# Patient Record
Sex: Female | Born: 1985 | Race: White | Hispanic: No | Marital: Married | Smoking: Current every day smoker
Health system: Southern US, Community
[De-identification: ages and names within clinical notes are randomized; demographics above are authoritative.]

## PROBLEM LIST (undated history)

## (undated) DIAGNOSIS — R569 Unspecified convulsions: Secondary | ICD-10-CM

## (undated) DIAGNOSIS — F32A Depression, unspecified: Secondary | ICD-10-CM

## (undated) DIAGNOSIS — F329 Major depressive disorder, single episode, unspecified: Secondary | ICD-10-CM

## (undated) DIAGNOSIS — F419 Anxiety disorder, unspecified: Secondary | ICD-10-CM

## (undated) HISTORY — PX: HIP SURGERY: SHX245

---

## 2004-09-22 ENCOUNTER — Emergency Department: Payer: Self-pay | Admitting: Internal Medicine

## 2010-12-22 ENCOUNTER — Emergency Department: Payer: Self-pay | Admitting: Emergency Medicine
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
## 2014-04-29 ENCOUNTER — Observation Stay: Payer: Self-pay | Admitting: Obstetrics and Gynecology

## 2014-05-01 ENCOUNTER — Observation Stay: Payer: Self-pay | Admitting: Obstetrics and Gynecology

## 2014-05-01 LAB — URIC ACID: URIC ACID: 4 mg/dL (ref 2.6–6.0)

## 2014-05-01 LAB — BASIC METABOLIC PANEL
Anion Gap: 10 (ref 7–16)
BUN: 12 mg/dL (ref 7–18)
CALCIUM: 9 mg/dL (ref 8.5–10.1)
CO2: 22 mmol/L (ref 21–32)
CREATININE: 0.67 mg/dL (ref 0.60–1.30)
Chloride: 105 mmol/L (ref 98–107)
EGFR (African American): >60
Glucose: 85 mg/dL (ref 65–99)
Osmolality: 273 (ref 275–301)
Potassium: 3.8 mmol/L (ref 3.5–5.1)
Sodium: 137 mmol/L (ref 136–145)

## 2014-05-01 LAB — SGOT (AST)(ARMC): SGOT(AST): 16 U/L (ref 15–37)

## 2014-05-01 LAB — HEMOGLOBIN: HGB: 10.1 g/dL — ABNORMAL LOW (ref 12.0–16.0)

## 2014-05-01 LAB — DRUG SCREEN, URINE
Amphetamines, Ur Screen: NEGATIVE (ref ?–1000)
BARBITURATES, UR SCREEN: NEGATIVE (ref ?–200)
Benzodiazepine, Ur Scrn: POSITIVE (ref ?–200)
COCAINE METABOLITE, UR ~~LOC~~: NEGATIVE (ref ?–300)
Cannabinoid 50 Ng, Ur ~~LOC~~: NEGATIVE (ref ?–50)
MDMA (ECSTASY) UR SCREEN: NEGATIVE (ref ?–500)
METHADONE, UR SCREEN: NEGATIVE (ref ?–300)
Opiate, Ur Screen: POSITIVE (ref ?–300)
PHENCYCLIDINE (PCP) UR S: NEGATIVE (ref ?–25)
Tricyclic, Ur Screen: NEGATIVE (ref ?–1000)

## 2014-05-01 LAB — WBC: WBC: 7.3 10*3/uL (ref 3.6–11.0)

## 2014-05-01 LAB — PROTEIN / CREATININE RATIO, URINE
Creatinine, Urine: 229.5 mg/dL — ABNORMAL HIGH (ref 30.0–125.0)
PROTEIN/CREAT. RATIO: 118 mg/g{creat} (ref 0–200)
Protein, Random Urine: 27 mg/dL — ABNORMAL HIGH (ref 0–12)

## 2014-05-01 LAB — HEMATOCRIT: HCT: 30.5 % — ABNORMAL LOW (ref 35.0–47.0)

## 2014-05-01 LAB — RBC: RBC: 3.66 10*6/uL — ABNORMAL LOW (ref 3.80–5.20)

## 2014-05-01 LAB — PLATELET COUNT: Platelet: 261 10*3/uL (ref 150–440)

## 2014-05-15 ENCOUNTER — Inpatient Hospital Stay: Payer: Self-pay

## 2014-05-15 LAB — DRUG SCREEN, URINE
Amphetamines, Ur Screen: NEGATIVE (ref ?–1000)
Barbiturates, Ur Screen: NEGATIVE (ref ?–200)
Benzodiazepine, Ur Scrn: POSITIVE (ref ?–200)
CANNABINOID 50 NG, UR ~~LOC~~: NEGATIVE (ref ?–50)
Cocaine Metabolite,Ur ~~LOC~~: NEGATIVE (ref ?–300)
MDMA (ECSTASY) UR SCREEN: NEGATIVE (ref ?–500)
Methadone, Ur Screen: NEGATIVE (ref ?–300)
Opiate, Ur Screen: NEGATIVE (ref ?–300)
PHENCYCLIDINE (PCP) UR S: NEGATIVE (ref ?–25)
Tricyclic, Ur Screen: NEGATIVE (ref ?–1000)

## 2014-05-15 LAB — CBC WITH DIFFERENTIAL/PLATELET
Basophil #: 0 10*3/uL (ref 0.0–0.1)
Basophil %: 0.3 %
Eosinophil #: 0.2 10*3/uL (ref 0.0–0.7)
Eosinophil %: 2.8 %
HCT: 33.6 % — ABNORMAL LOW (ref 35.0–47.0)
HGB: 10.8 g/dL — AB (ref 12.0–16.0)
Lymphocyte #: 2 10*3/uL (ref 1.0–3.6)
Lymphocyte %: 24 %
MCH: 27.2 pg (ref 26.0–34.0)
MCHC: 32.2 g/dL (ref 32.0–36.0)
MCV: 85 fL (ref 80–100)
Monocyte #: 0.6 x10 3/mm (ref 0.2–0.9)
Monocyte %: 7.1 %
Neutrophil #: 5.6 10*3/uL (ref 1.4–6.5)
Neutrophil %: 65.8 %
Platelet: 325 10*3/uL (ref 150–440)
RBC: 3.98 10*6/uL (ref 3.80–5.20)
RDW: 14.7 % — ABNORMAL HIGH (ref 11.5–14.5)
WBC: 8.4 10*3/uL (ref 3.6–11.0)

## 2014-05-15 LAB — APTT: Activated PTT: 26.4 secs (ref 23.6–35.9)

## 2014-05-15 LAB — PROTEIN / CREATININE RATIO, URINE
Creatinine, Urine: 54.7 mg/dL (ref 30.0–125.0)
PROTEIN, RANDOM URINE: 202 mg/dL — AB (ref 0–12)
PROTEIN/CREAT. RATIO: 3693 mg/g{creat} — AB (ref 0–200)

## 2014-05-15 LAB — PROTIME-INR
INR: 0.8
PROTHROMBIN TIME: 11.5 s (ref 11.5–14.7)

## 2014-05-15 LAB — GC/CHLAMYDIA PROBE AMP

## 2014-05-15 LAB — FIBRIN DEGRADATION PROD.(ARMC ONLY): Fibrin Degradation Prod.: <10 ug/ml (ref 2.1–7.7)

## 2014-05-16 LAB — HEPATIC FUNCTION PANEL A (ARMC)
ALBUMIN: 2 g/dL — AB (ref 3.4–5.0)
Alkaline Phosphatase: 123 U/L — ABNORMAL HIGH
Bilirubin, Direct: <0.1 mg/dL (ref 0.0–0.2)
Bilirubin,Total: 0.1 mg/dL — ABNORMAL LOW (ref 0.2–1.0)
SGOT(AST): 22 U/L (ref 15–37)
SGPT (ALT): 14 U/L
TOTAL PROTEIN: 5.8 g/dL — AB (ref 6.4–8.2)

## 2014-05-17 LAB — CBC WITH DIFFERENTIAL/PLATELET
Basophil #: 0.1 10*3/uL (ref 0.0–0.1)
Basophil %: 1.1 %
EOS ABS: 0.1 10*3/uL (ref 0.0–0.7)
Eosinophil %: 1 %
HCT: 29.5 % — ABNORMAL LOW (ref 35.0–47.0)
HGB: 9.5 g/dL — ABNORMAL LOW (ref 12.0–16.0)
Lymphocyte #: 1.5 10*3/uL (ref 1.0–3.6)
Lymphocyte %: 14.4 %
MCH: 27.6 pg (ref 26.0–34.0)
MCHC: 32.4 g/dL (ref 32.0–36.0)
MCV: 85 fL (ref 80–100)
MONO ABS: 0.9 x10 3/mm (ref 0.2–0.9)
MONOS PCT: 8.8 %
NEUTROS PCT: 74.7 %
Neutrophil #: 8 10*3/uL — ABNORMAL HIGH (ref 1.4–6.5)
Platelet: 252 10*3/uL (ref 150–440)
RBC: 3.46 10*6/uL — ABNORMAL LOW (ref 3.80–5.20)
RDW: 15.1 % — ABNORMAL HIGH (ref 11.5–14.5)
WBC: 10.7 10*3/uL (ref 3.6–11.0)

## 2014-05-17 LAB — COMPREHENSIVE METABOLIC PANEL
ALBUMIN: 2 g/dL — AB (ref 3.4–5.0)
ANION GAP: 8 (ref 7–16)
Alkaline Phosphatase: 122 U/L — ABNORMAL HIGH
BUN: 6 mg/dL — ABNORMAL LOW (ref 7–18)
Bilirubin,Total: 0.1 mg/dL — ABNORMAL LOW (ref 0.2–1.0)
Calcium, Total: 7.7 mg/dL — ABNORMAL LOW (ref 8.5–10.1)
Chloride: 111 mmol/L — ABNORMAL HIGH (ref 98–107)
Co2: 24 mmol/L (ref 21–32)
Creatinine: 0.91 mg/dL (ref 0.60–1.30)
EGFR (African American): >60
EGFR (Non-African Amer.): >60
GLUCOSE: 84 mg/dL (ref 65–99)
OSMOLALITY: 282 (ref 275–301)
Potassium: 3.7 mmol/L (ref 3.5–5.1)
SGOT(AST): 18 U/L (ref 15–37)
SGPT (ALT): 13 U/L — ABNORMAL LOW
Sodium: 143 mmol/L (ref 136–145)
Total Protein: 5 g/dL — ABNORMAL LOW (ref 6.4–8.2)

## 2014-05-18 LAB — HEMATOCRIT: HCT: 26.4 % — ABNORMAL LOW (ref 35.0–47.0)

## 2014-05-21 LAB — AEROBIC CULTURE

## 2014-05-21 LAB — MISC AER/ANAEROBIC CULT.

## 2014-05-23 LAB — MISC AER/ANAEROBIC CULT.

## 2014-09-11 LAB — SURGICAL PATHOLOGY

## 2014-09-26 NOTE — H&P (Signed)
L&D Evaluation:  History:  HPI Pt is a 29 yo G2P0101 pt of [facility] at 31.[redacted] weeks GA with an EDC of 10/10/14 who presents to L&D with reports of decreased fetal movement. She reports that she has not felt the baby move today or yesterday but is not sure when the movement stopped. She denies vb, lof or ctx. Her prenatal history is significant for IOL at 33 weeks with G1 for HELLP syndrome, late entry to care at 19 weeks, benzo use in pregnancy. She is A+, RNI, HIV neg, RPR NR, Hep B neg, varicella unknown, GBs unknown   Patient's Medical History genital warts, HELLP syndrome, seizures x2 in 2013, 2015, PPD, chlamydia, migraines, congenital hip dysplasia, "disc in neck problems"   Patient's Surgical History hip replacement/ hip surgery x4, LEEP,   Medications Pre Serbiaatal Vitamins  diclegis, buspar, ultram, ativan   Allergies NKDA   Social History tobacco   Family History Non-Contributory   ROS:  ROS All systems were reviewed.  HEENT, CNS, GI, GU, Respiratory, CV, Renal and Musculoskeletal systems were found to be normal.   Exam:  Vital Signs some elevated diastolic pressures   General pt visably upset stating " it is all my fault"   Mental Status clear   Chest clear   Heart normal sinus rhythm   Abdomen gravid, non-tender   Pelvic no external lesions, 1/40/-3 at 2000   Mebranes Intact   FHT absent   Ucx absent   Skin dry, no lesions, no rashes   Lymph no lymphadenopathy   Impression:  Impression IUFD at 31.1 weeks   Plan:  Plan admit for IOL for IUFD, cytotec to induce labor, labs, fetal and placental cultures after delivery   Follow Up Appointment need to schedule   Electronic Signatures: Jannet MantisSubudhi, Melyssa Signor (CNM)  (Signed 28-Dec-15 21:47)  Authored: L&D Evaluation   Last Updated: 28-Dec-15 21:47 by Jannet MantisSubudhi, Kyrin Gratz (CNM)

## 2016-01-04 ENCOUNTER — Emergency Department (HOSPITAL_BASED_OUTPATIENT_CLINIC_OR_DEPARTMENT_OTHER): Payer: Medicare Other

## 2016-01-04 ENCOUNTER — Emergency Department (HOSPITAL_BASED_OUTPATIENT_CLINIC_OR_DEPARTMENT_OTHER)
Admission: EM | Admit: 2016-01-04 | Discharge: 2016-01-04 | Disposition: A | Discharge status: Home / Self Care | Payer: Medicare Other | Attending: Emergency Medicine | Admitting: Emergency Medicine

## 2016-01-04 ENCOUNTER — Encounter (HOSPITAL_BASED_OUTPATIENT_CLINIC_OR_DEPARTMENT_OTHER): Payer: Self-pay

## 2016-01-04 DIAGNOSIS — R079 Chest pain, unspecified: Secondary | ICD-10-CM | POA: Diagnosis present

## 2016-01-04 DIAGNOSIS — F17210 Nicotine dependence, cigarettes, uncomplicated: Secondary | ICD-10-CM | POA: Diagnosis not present

## 2016-01-04 DIAGNOSIS — R0789 Other chest pain: Secondary | ICD-10-CM | POA: Insufficient documentation

## 2016-01-04 DIAGNOSIS — Z79899 Other long term (current) drug therapy: Secondary | ICD-10-CM | POA: Insufficient documentation

## 2016-01-04 HISTORY — DX: Unspecified convulsions: R56.9

## 2016-01-04 HISTORY — DX: Major depressive disorder, single episode, unspecified: F32.9

## 2016-01-04 HISTORY — DX: Depression, unspecified: F32.A

## 2016-01-04 HISTORY — DX: Anxiety disorder, unspecified: F41.9

## 2016-01-04 LAB — COMPREHENSIVE METABOLIC PANEL
ALBUMIN: 4.1 g/dL (ref 3.5–5.0)
ALK PHOS: 59 U/L (ref 38–126)
ALT: 18 U/L (ref 14–54)
ANION GAP: 10 (ref 5–15)
AST: 18 U/L (ref 15–41)
BUN: 17 mg/dL (ref 6–20)
CALCIUM: 8.7 mg/dL — AB (ref 8.9–10.3)
CO2: 26 mmol/L (ref 22–32)
Chloride: 101 mmol/L (ref 101–111)
Creatinine, Ser: 0.83 mg/dL (ref 0.44–1.00)
Glucose, Bld: 93 mg/dL (ref 65–99)
Potassium: 3 mmol/L — ABNORMAL LOW (ref 3.5–5.1)
SODIUM: 137 mmol/L (ref 135–145)
Total Bilirubin: 0.2 mg/dL — ABNORMAL LOW (ref 0.3–1.2)
Total Protein: 7.1 g/dL (ref 6.5–8.1)

## 2016-01-04 LAB — CBC WITH DIFFERENTIAL/PLATELET
BASOS ABS: 0 10*3/uL (ref 0.0–0.1)
BASOS PCT: 0 %
EOS ABS: 0.1 10*3/uL (ref 0.0–0.7)
Eosinophils Relative: 1 %
HCT: 38 % (ref 36.0–46.0)
HEMOGLOBIN: 13.1 g/dL (ref 12.0–15.0)
Lymphocytes Relative: 38 %
Lymphs Abs: 3.2 10*3/uL (ref 0.7–4.0)
MCH: 29.6 pg (ref 26.0–34.0)
MCHC: 34.5 g/dL (ref 30.0–36.0)
MCV: 85.8 fL (ref 78.0–100.0)
MONOS PCT: 9 %
Monocytes Absolute: 0.7 10*3/uL (ref 0.1–1.0)
NEUTROS PCT: 52 %
Neutro Abs: 4.5 10*3/uL (ref 1.7–7.7)
Platelets: 355 10*3/uL (ref 150–400)
RBC: 4.43 MIL/uL (ref 3.87–5.11)
RDW: 13.3 % (ref 11.5–15.5)
WBC: 8.4 10*3/uL (ref 4.0–10.5)

## 2016-01-04 LAB — PREGNANCY, URINE: PREG TEST UR: NEGATIVE

## 2016-01-04 LAB — TROPONIN I

## 2016-01-04 MED ORDER — MORPHINE SULFATE (PF) 4 MG/ML IV SOLN
4.0000 mg | INTRAVENOUS | Status: DC | PRN
  Administered 2016-01-04: 4 mg via INTRAVENOUS
  Filled 2016-01-04: qty 1

## 2016-01-04 MED ORDER — IOPAMIDOL (ISOVUE-370) INJECTION 76%
100.0000 mL | Freq: Once | INTRAVENOUS | Status: AC | PRN
  Administered 2016-01-04: 100 mL via INTRAVENOUS

## 2016-01-04 MED ORDER — FAMOTIDINE IN NACL 20-0.9 MG/50ML-% IV SOLN
20.0000 mg | Freq: Once | INTRAVENOUS | Status: AC
Start: 2016-01-04 — End: 2016-01-04
  Administered 2016-01-04: 20 mg via INTRAVENOUS
  Filled 2016-01-04: qty 50

## 2016-01-04 MED ORDER — ONDANSETRON HCL 4 MG/2ML IJ SOLN
4.0000 mg | Freq: Once | INTRAMUSCULAR | Status: AC
Start: 2016-01-04 — End: 2016-01-04
  Administered 2016-01-04: 4 mg via INTRAVENOUS
  Filled 2016-01-04: qty 2

## 2016-01-04 MED ORDER — SODIUM CHLORIDE 0.9 % IV BOLUS (SEPSIS)
1000.0000 mL | Freq: Once | INTRAVENOUS | Status: AC
  Administered 2016-01-04: 1000 mL via INTRAVENOUS

## 2016-01-04 MED ORDER — PANTOPRAZOLE SODIUM 20 MG PO TBEC: 20.0000 mg | DELAYED_RELEASE_TABLET | Freq: Every day | ORAL | 0 refills | Status: DC

## 2016-01-04 MED ORDER — GI COCKTAIL ~~LOC~~
30.0000 mL | Freq: Once | ORAL | Status: AC
  Administered 2016-01-04: 30 mL via ORAL
  Filled 2016-01-04: qty 30

## 2016-01-04 MED ORDER — PROMETHAZINE HCL 25 MG/ML IJ SOLN
25.0000 mg | Freq: Once | INTRAMUSCULAR | Status: AC
  Administered 2016-01-04: 25 mg via INTRAVENOUS
  Filled 2016-01-04: qty 1

## 2016-01-04 MED ORDER — HYDROMORPHONE HCL 1 MG/ML IJ SOLN
0.5000 mg | Freq: Once | INTRAMUSCULAR | Status: AC
  Administered 2016-01-04: 0.5 mg via INTRAVENOUS
  Filled 2016-01-04: qty 1

## 2016-01-04 MED ORDER — ONDANSETRON HCL 4 MG/2ML IJ SOLN
4.0000 mg | Freq: Once | INTRAMUSCULAR | Status: AC
  Administered 2016-01-04: 4 mg via INTRAVENOUS
  Filled 2016-01-04: qty 2

## 2016-01-04 MED ORDER — SUCRALFATE 1 GM/10ML PO SUSP: 1.0000 g | Freq: Three times a day (TID) | ORAL | 0 refills | Status: DC
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
## 2016-01-04 NOTE — ED Notes (Signed)
EDP at bedside discussing results with family and patient.

## 2016-01-04 NOTE — ED Triage Notes (Addendum)
CP started yesterday-was seen by PCP 8/3-states HR 140s-started on atenelol 25mg  daily and cards referral-pt has been noncompliant with med-sent from urgent care today-NAD

## 2016-01-04 NOTE — ED Notes (Signed)
Pt reports increased pain since returning from CT. EDP made aware.

## 2016-01-04 NOTE — ED Notes (Signed)
Pt vomited after administration of morphine. EDP made aware. New orders received.

## 2016-01-04 NOTE — ED Notes (Signed)
Pt transported to CT ?

## 2016-01-04 NOTE — Discharge Instructions (Signed)
Please follow with your cardiologist at your scheduled appointment.   Do not take any NSAIDs (motrin, ibuprofen, advil, aleve, excedrin, aspirin, naproxen, goody's powder,  etc.) because they can further irritate your stomach.   Please follow with your primary care doctor in the next 2 days for a check-up. They must obtain records for further management.   Do not hesitate to return to the Emergency Department for any new, worsening or concerning symptoms.

## 2016-03-11 DIAGNOSIS — R109 Unspecified abdominal pain: Secondary | ICD-10-CM | POA: Diagnosis not present

## 2016-03-11 DIAGNOSIS — R509 Fever, unspecified: Secondary | ICD-10-CM

## 2016-03-11 DIAGNOSIS — R112 Nausea with vomiting, unspecified: Secondary | ICD-10-CM | POA: Diagnosis not present

## 2016-03-12 DIAGNOSIS — E669 Obesity, unspecified: Secondary | ICD-10-CM

## 2016-03-12 DIAGNOSIS — G40909 Epilepsy, unspecified, not intractable, without status epilepticus: Secondary | ICD-10-CM

## 2016-03-12 DIAGNOSIS — F418 Other specified anxiety disorders: Secondary | ICD-10-CM

## 2016-03-12 DIAGNOSIS — M1990 Unspecified osteoarthritis, unspecified site: Secondary | ICD-10-CM

## 2016-03-12 DIAGNOSIS — R509 Fever, unspecified: Secondary | ICD-10-CM | POA: Diagnosis not present

## 2016-03-13 DIAGNOSIS — G40909 Epilepsy, unspecified, not intractable, without status epilepticus: Secondary | ICD-10-CM | POA: Diagnosis not present

## 2016-03-13 DIAGNOSIS — G43109 Migraine with aura, not intractable, without status migrainosus: Secondary | ICD-10-CM

## 2016-03-13 DIAGNOSIS — E669 Obesity, unspecified: Secondary | ICD-10-CM | POA: Diagnosis not present

## 2016-03-13 DIAGNOSIS — M1990 Unspecified osteoarthritis, unspecified site: Secondary | ICD-10-CM | POA: Diagnosis not present

## 2016-03-13 DIAGNOSIS — R509 Fever, unspecified: Secondary | ICD-10-CM | POA: Diagnosis not present

## 2016-03-14 DIAGNOSIS — R509 Fever, unspecified: Secondary | ICD-10-CM | POA: Diagnosis not present

## 2016-03-14 DIAGNOSIS — M1990 Unspecified osteoarthritis, unspecified site: Secondary | ICD-10-CM | POA: Diagnosis not present

## 2016-03-14 DIAGNOSIS — E669 Obesity, unspecified: Secondary | ICD-10-CM | POA: Diagnosis not present

## 2016-03-14 DIAGNOSIS — G40909 Epilepsy, unspecified, not intractable, without status epilepticus: Secondary | ICD-10-CM | POA: Diagnosis not present

## 2016-09-18 ENCOUNTER — Emergency Department (HOSPITAL_BASED_OUTPATIENT_CLINIC_OR_DEPARTMENT_OTHER)
Admission: EM | Admit: 2016-09-18 | Discharge: 2016-09-19 | Disposition: A | Discharge status: Home / Self Care | Payer: Medicare Other | Attending: Emergency Medicine | Admitting: Emergency Medicine

## 2016-09-18 ENCOUNTER — Encounter (HOSPITAL_BASED_OUTPATIENT_CLINIC_OR_DEPARTMENT_OTHER): Payer: Self-pay | Admitting: *Deleted

## 2016-09-18 DIAGNOSIS — F17210 Nicotine dependence, cigarettes, uncomplicated: Secondary | ICD-10-CM | POA: Insufficient documentation

## 2016-09-18 DIAGNOSIS — M545 Low back pain, unspecified: Secondary | ICD-10-CM

## 2016-09-18 DIAGNOSIS — R10817 Generalized abdominal tenderness: Secondary | ICD-10-CM | POA: Insufficient documentation

## 2016-09-18 DIAGNOSIS — Z79899 Other long term (current) drug therapy: Secondary | ICD-10-CM | POA: Insufficient documentation

## 2016-09-18 DIAGNOSIS — R600 Localized edema: Secondary | ICD-10-CM | POA: Diagnosis present

## 2016-09-18 LAB — URINALYSIS, ROUTINE W REFLEX MICROSCOPIC
Bilirubin Urine: NEGATIVE
GLUCOSE, UA: NEGATIVE mg/dL
Hgb urine dipstick: NEGATIVE
KETONES UR: NEGATIVE mg/dL
LEUKOCYTES UA: NEGATIVE
NITRITE: NEGATIVE
PROTEIN: NEGATIVE mg/dL
Specific Gravity, Urine: 1.026 (ref 1.005–1.030)
pH: 5 (ref 5.0–8.0)

## 2016-09-18 LAB — CBC WITH DIFFERENTIAL/PLATELET
BASOS ABS: 0 10*3/uL (ref 0.0–0.1)
BASOS PCT: 0 %
Eosinophils Absolute: 0.2 10*3/uL (ref 0.0–0.7)
Eosinophils Relative: 4 %
HEMATOCRIT: 34.6 % — AB (ref 36.0–46.0)
HEMOGLOBIN: 11.8 g/dL — AB (ref 12.0–15.0)
LYMPHS PCT: 36 %
Lymphs Abs: 2.1 10*3/uL (ref 0.7–4.0)
MCH: 29.9 pg (ref 26.0–34.0)
MCHC: 34.1 g/dL (ref 30.0–36.0)
MCV: 87.8 fL (ref 78.0–100.0)
Monocytes Absolute: 0.7 10*3/uL (ref 0.1–1.0)
Monocytes Relative: 11 %
NEUTROS ABS: 3 10*3/uL (ref 1.7–7.7)
NEUTROS PCT: 49 %
Platelets: 334 10*3/uL (ref 150–400)
RBC: 3.94 MIL/uL (ref 3.87–5.11)
RDW: 13.6 % (ref 11.5–15.5)
WBC: 6 10*3/uL (ref 4.0–10.5)

## 2016-09-18 LAB — BASIC METABOLIC PANEL
ANION GAP: 8 (ref 5–15)
BUN: 11 mg/dL (ref 6–20)
CALCIUM: 8.8 mg/dL — AB (ref 8.9–10.3)
CO2: 26 mmol/L (ref 22–32)
Chloride: 103 mmol/L (ref 101–111)
Creatinine, Ser: 0.77 mg/dL (ref 0.44–1.00)
GFR calc non Af Amer: >60 mL/min (ref >60)
GLUCOSE: 134 mg/dL — AB (ref 65–99)
POTASSIUM: 3.8 mmol/L (ref 3.5–5.1)
Sodium: 137 mmol/L (ref 135–145)

## 2016-09-18 LAB — I-STAT CG4 LACTIC ACID, ED: Lactic Acid, Venous: 1.32 mmol/L (ref 0.5–1.9)

## 2016-09-18 NOTE — ED Provider Notes (Signed)
[facility] Provider Note: Tina Dell, MD, FACEP  CSN: [id number] MRN: [id number] ARRIVAL: 09/18/16 at 2054 ROOM: [facility]08/[facility]08  By signing my name below, I, Tina Mcdaniel, attest that this documentation has been prepared under the direction and in the presence of Paula Libra, MD. Electronically Signed: Diona Mcdaniel, ED Scribe. 09/18/16. 12:10 AM.   CHIEF COMPLAINT  Leg Swelling   HISTORY OF PRESENT ILLNESS  Tina Mcdaniel is a 31 y.o. female that c/o of bilateral leg edema that started three days ago. She reports gaining 17 pounds in the last 3 days from the swelling. Pt notes severe pain to her lower back (bilateral paralumbar) that started yesterday; she denies acute injury. She went to an urgent care to get "water pills" and states was told to come to the ED. She states when the urgent care physician palpated her abdomen it was very tender. She denies having abdominal pain before that and is not currently having abdominal pain at rest. She denies CP, SOB, nausea, vomiting and diarrhea.   The patient is requesting IV Dilaudid for her pain stating that she got no relief with her home medications. She is on Xanax one milligram 3 times a day and oxycodone 10 milligrams 3 times a day. She has been on these for an extended period of time.  Past Medical History:  Diagnosis Date  . Anxiety   . Depression   . Seizures (HCC)     Past Surgical History:  Procedure Laterality Date  . HIP SURGERY      No family history on file.  Social History  Substance Use Topics  . Smoking status: Current Every Day Smoker    Types: Cigarettes  . Smokeless tobacco: Never Used  . Alcohol use No     Comment: occ    Prior to Admission medications   Medication Sig Start Date End Date Taking? Authorizing Provider  ALPRAZolam (XANAX) 1 MG tablet 1 mg 3 (three) times daily.   Yes Historical Provider, MD  escitalopram (LEXAPRO) 20 MG tablet Take 20 mg by mouth daily.   Yes Historical  Provider, MD  Lurasidone HCl (LATUDA PO) Take by mouth.   Yes Historical Provider, MD  METHOCARBAMOL PO Take by mouth.   Yes Historical Provider, MD  OLANZapine (ZYPREXA) 2.5 MG tablet Take 2.5 mg by mouth at bedtime.   Yes Historical Provider, MD  oxyCODONE-acetaminophen (PERCOCET) 10-325 MG tablet Take 1 tablet by mouth every 4 (four) hours as needed for pain.   Yes Historical Provider, MD    Allergies Morphine and related   REVIEW OF SYSTEMS  Negative except as noted here or in the History of Present Illness.   PHYSICAL EXAMINATION  Initial Vital Signs Blood pressure 123/88, pulse 98, temperature 98.9 F (37.2 C), resp. rate 18, height 5\' 4"  (1.626 m), weight 217 lb (98.4 kg), last menstrual period 09/16/2016, SpO2 95 %.  Examination General: Well-developed, well-nourished female in no acute distress; appearance consistent with age of record HENT: normocephalic; atraumatic Eyes: pupils equal, round and reactive to light; extraocular muscles intact Neck: supple Heart: regular rate and rhythm Lungs: clear to auscultation bilaterally Abdomen: soft; nondistended; no masses or hepatosplenomegaly; bowel sounds present; generalized abdominal tenderness with sparing of the LUQ Back; bilateral para-lumbar soft tissue tenderness, right great than left Extremities: No deformity; full range of motion; pulses normal; trace edema of lower legs Neurologic: Awake, alert and oriented; mild dysarthria; motor function intact in all extremities and symmetric; no facial droop Skin: Warm  and dry; chronic appearing scaly plaques of interior ankles Psychiatric: Flat affect; slowness of speech   RESULTS  Summary of this visit's results, reviewed by myself:   EKG Interpretation  Date/Time:    Ventricular Rate:    PR Interval:    QRS Duration:   QT Interval:    QTC Calculation:   R Axis:     Text Interpretation:        Laboratory Studies: Results for orders placed or performed during the  hospital encounter of 09/18/16 (from the past 24 hour(s))  Urinalysis, Routine w reflex microscopic     Status: None   Collection Time: 09/18/16  9:06 PM  Result Value Ref Range   Color, Urine YELLOW YELLOW   APPearance CLEAR CLEAR   Specific Gravity, Urine 1.026 1.005 - 1.030   pH 5.0 5.0 - 8.0   Glucose, UA NEGATIVE NEGATIVE mg/dL   Hgb urine dipstick NEGATIVE NEGATIVE   Bilirubin Urine NEGATIVE NEGATIVE   Ketones, ur NEGATIVE NEGATIVE mg/dL   Protein, ur NEGATIVE NEGATIVE mg/dL   Nitrite NEGATIVE NEGATIVE   Leukocytes, UA NEGATIVE NEGATIVE  Pregnancy, urine     Status: None   Collection Time: 09/18/16  9:06 PM  Result Value Ref Range   Preg Test, Ur NEGATIVE NEGATIVE  Rapid urine drug screen (hospital performed)     Status: Abnormal   Collection Time: 09/18/16  9:06 PM  Result Value Ref Range   Opiates POSITIVE (A) NONE DETECTED   Cocaine NONE DETECTED NONE DETECTED   Benzodiazepines POSITIVE (A) NONE DETECTED   Amphetamines NONE DETECTED NONE DETECTED   Tetrahydrocannabinol NONE DETECTED NONE DETECTED   Barbiturates NONE DETECTED NONE DETECTED  CBC with Differential     Status: Abnormal   Collection Time: 09/18/16  9:48 PM  Result Value Ref Range   WBC 6.0 4.0 - 10.5 K/uL   RBC 3.94 3.87 - 5.11 MIL/uL   Hemoglobin 11.8 (L) 12.0 - 15.0 g/dL   HCT 16.1 (L) 09.6 - 04.5 %   MCV 87.8 78.0 - 100.0 fL   MCH 29.9 26.0 - 34.0 pg   MCHC 34.1 30.0 - 36.0 g/dL   RDW 40.9 81.1 - 91.4 %   Platelets 334 150 - 400 K/uL   Neutrophils Relative % 49 %   Neutro Abs 3.0 1.7 - 7.7 K/uL   Lymphocytes Relative 36 %   Lymphs Abs 2.1 0.7 - 4.0 K/uL   Monocytes Relative 11 %   Monocytes Absolute 0.7 0.1 - 1.0 K/uL   Eosinophils Relative 4 %   Eosinophils Absolute 0.2 0.0 - 0.7 K/uL   Basophils Relative 0 %   Basophils Absolute 0.0 0.0 - 0.1 K/uL  Basic metabolic panel     Status: Abnormal   Collection Time: 09/18/16  9:48 PM  Result Value Ref Range   Sodium 137 135 - 145 mmol/L    Potassium 3.8 3.5 - 5.1 mmol/L   Chloride 103 101 - 111 mmol/L   CO2 26 22 - 32 mmol/L   Glucose, Bld 134 (H) 65 - 99 mg/dL   BUN 11 6 - 20 mg/dL   Creatinine, Ser 7.82 0.44 - 1.00 mg/dL   Calcium 8.8 (L) 8.9 - 10.3 mg/dL   GFR calc non Af Amer >60 >60 mL/min   GFR calc Af Amer >60 >60 mL/min   Anion gap 8 5 - 15  Hepatic function panel     Status: Abnormal   Collection Time: 09/18/16  9:48 PM  Result  Value Ref Range   Total Protein 7.0 6.5 - 8.1 g/dL   Albumin 3.6 3.5 - 5.0 g/dL   AST 26 15 - 41 U/L   ALT 16 14 - 54 U/L   Alkaline Phosphatase 60 38 - 126 U/L   Total Bilirubin 0.3 0.3 - 1.2 mg/dL   Bilirubin, Direct <4.0<0.1 (L) 0.1 - 0.5 mg/dL   Indirect Bilirubin NOT CALCULATED 0.3 - 0.9 mg/dL  Lipase, blood     Status: None   Collection Time: 09/18/16  9:48 PM  Result Value Ref Range   Lipase 19 11 - 51 U/L  I-Stat CG4 Lactic Acid, ED     Status: None   Collection Time: 09/18/16  9:53 PM  Result Value Ref Range   Lactic Acid, Venous 1.32 0.5 - 1.9 mmol/L   Imaging Studies: No results found.  ED COURSE  Nursing notes and initial vitals signs, including pulse oximetry, reviewed.  Vitals:   09/18/16 2100 09/18/16 2102 09/18/16 2149 09/18/16 2328  BP:  114/72  123/88  Pulse:  (!) 104  98  Resp:  19  18  Temp:  98.5 F (36.9 C) 98.9 F (37.2 C)   TempSrc:  Oral    SpO2:  98%  95%  Weight: 217 lb (98.4 kg)     Height: 5\' 4"  (1.626 m)      1:10 AM I do not appreciate significant edema on the patient's examination. A weight gain of 17 pounds in 3 days seems unlikely given that her clothes appear to fit comfortably. She was advised of normal laboratory work. I do not believe that diuretics are indicated at the present time but should her perceived edema persist or worsen she should follow-up with her PCP. She was advised that should her abdominal pain worsen or localize to the right or quadrant she should return. She was advised that parenteral narcotics are contraindicated as  she is on chronic oxycodone and Xanax.   PROCEDURES    ED DIAGNOSES     ICD-9-CM ICD-10-CM   1. Acute bilateral low back pain without sciatica 724.2 M54.5    338.19    2. Generalized abdominal tenderness without rebound tenderness 789.67 R10.817     I personally performed the services described in this documentation, which was scribed in my presence. The recorded information has been reviewed and is accurate.     Paula LibraJohn Shara Hartis, MD 09/19/16 [phone]

## 2016-09-18 NOTE — ED Triage Notes (Signed)
Swelling in her legs x 3 days. Back and abdominal pain tonight. [facility]

## 2016-09-19 DIAGNOSIS — M545 Low back pain: Secondary | ICD-10-CM | POA: Diagnosis not present

## 2016-09-19 LAB — RAPID URINE DRUG SCREEN, HOSP PERFORMED
Amphetamines: NOT DETECTED
BENZODIAZEPINES: POSITIVE — AB
Barbiturates: NOT DETECTED
COCAINE: NOT DETECTED
Opiates: POSITIVE — AB
Tetrahydrocannabinol: NOT DETECTED

## 2016-09-19 LAB — HEPATIC FUNCTION PANEL
ALT: 16 U/L (ref 14–54)
AST: 26 U/L (ref 15–41)
Albumin: 3.6 g/dL (ref 3.5–5.0)
Alkaline Phosphatase: 60 U/L (ref 38–126)
Total Bilirubin: 0.3 mg/dL (ref 0.3–1.2)
Total Protein: 7 g/dL (ref 6.5–8.1)

## 2016-09-19 LAB — PREGNANCY, URINE: PREG TEST UR: NEGATIVE

## 2016-09-19 LAB — LIPASE, BLOOD: LIPASE: 19 U/L (ref 11–51)

## 2016-09-19 MED ORDER — OXYCODONE-ACETAMINOPHEN 5-325 MG PO TABS
2.0000 | ORAL_TABLET | Freq: Once | ORAL | Status: AC
  Administered 2016-09-19: 2 via ORAL
  Filled 2016-09-19: qty 2

## 2016-09-19 NOTE — ED Notes (Signed)
Pt verbalizes understanding of d/c instructions and denies any further need at this time. 

## 2016-09-25 ENCOUNTER — Emergency Department (HOSPITAL_BASED_OUTPATIENT_CLINIC_OR_DEPARTMENT_OTHER)
Admission: EM | Admit: 2016-09-25 | Discharge: 2016-09-26 | Disposition: A | Discharge status: Home / Self Care | Payer: Medicare Other | Attending: Emergency Medicine | Admitting: Emergency Medicine

## 2016-09-25 ENCOUNTER — Encounter (HOSPITAL_BASED_OUTPATIENT_CLINIC_OR_DEPARTMENT_OTHER): Payer: Self-pay | Admitting: Emergency Medicine

## 2016-09-25 DIAGNOSIS — R600 Localized edema: Secondary | ICD-10-CM | POA: Insufficient documentation

## 2016-09-25 DIAGNOSIS — R609 Edema, unspecified: Secondary | ICD-10-CM

## 2016-09-25 DIAGNOSIS — F17210 Nicotine dependence, cigarettes, uncomplicated: Secondary | ICD-10-CM | POA: Insufficient documentation

## 2016-09-25 DIAGNOSIS — M7989 Other specified soft tissue disorders: Secondary | ICD-10-CM | POA: Diagnosis present

## 2016-09-25 NOTE — ED Provider Notes (Addendum)
[facility] Provider Note   CSN: [id number] Arrival date & time: 09/25/16  2329 By signing my name below, I, Levon Hedger, attest that this documentation has been prepared under the direction and in the presence of Marlee Trentman, MD . Electronically Signed: Levon Hedger, Scribe. 09/26/2016. 12:01 AM.   History   Chief Complaint Chief Complaint  Patient presents with  . Leg Swelling   HPI Tina Mcdaniel is a 31 y.o. female with a history of anxiety, depression and seizures who presents to the Emergency Department complaining of persistent unchanging bilateral foot swelling onset >10 days ago. She reports associated unintentional weight gain, and states she has gained 20 pounds in 4 days due to fluid retention. No OTC treatments tried for these symptoms PTA. Of note, pt was seen for the same on 09/18/16 and had a full work-up which was normal though she is stating with both nurse and scribe present that she was not examined and no bloodwork was performed. She is concerned that she has CHF and is requesting to be tested. Pt states she has been seen by a cardiologist in the past. Pt has no other acute complaints or associated symptoms at this time.    PCP: Jearld Lesch, MD   The history is provided by the patient. No language interpreter was used.  Foot Pain  This is a chronic problem. The current episode started more than 1 week ago. The problem occurs constantly. The problem has not changed since onset.Pertinent negatives include no chest pain, no abdominal pain, no headaches and no shortness of breath. Nothing aggravates the symptoms. Nothing relieves the symptoms. She has tried nothing for the symptoms. The treatment provided no relief.    Past Medical History:  Diagnosis Date  . Anxiety   . Depression   . Seizures (HCC)     There are no active problems to display for this patient.   Past Surgical History:  Procedure Laterality Date  . HIP SURGERY      OB  History    No data available       Home Medications    Prior to Admission medications   Medication Sig Start Date End Date Taking? Authorizing Provider  ALPRAZolam (XANAX) 1 MG tablet 1 mg 3 (three) times daily.    [provider]  escitalopram (LEXAPRO) 20 MG tablet Take 20 mg by mouth daily.    [provider]  Lurasidone HCl (LATUDA PO) Take by mouth.    [provider]  METHOCARBAMOL PO Take by mouth.    [provider]  OLANZapine (ZYPREXA) 2.5 MG tablet Take 2.5 mg by mouth at bedtime.    [provider]  oxyCODONE-acetaminophen (PERCOCET) 10-325 MG tablet Take 1 tablet by mouth every 4 (four) hours as needed for pain.    [provider]    Family History No family history on file.  Social History Social History  Substance Use Topics  . Smoking status: Current Every Day Smoker    Types: Cigarettes  . Smokeless tobacco: Never Used  . Alcohol use No     Comment: occ    Allergies   Morphine and related  Review of Systems Review of Systems  Constitutional: Negative for diaphoresis.  Respiratory: Negative for chest tightness and shortness of breath.   Cardiovascular: Negative for chest pain and palpitations.  Gastrointestinal: Negative for abdominal pain.  Musculoskeletal: Negative for neck pain and neck stiffness.  Neurological: Negative for headaches.  All other systems reviewed  and are negative.  All systems reviewed and are negative for acute change except as noted in the HPI.  Physical Exam Updated Vital Signs LMP 09/16/2016   Physical Exam  Constitutional: She is oriented to person, place, and time. She appears well-developed and well-nourished. No distress.  HENT:  Head: Normocephalic and atraumatic.  Mouth/Throat: Oropharynx is clear and moist. No oropharyngeal exudate.  Moist mucous membranes   Eyes: Conjunctivae are normal. Pupils are equal, round, and reactive to light.  Neck: Normal range of  motion. Neck supple. No JVD present.  Trachea midline No bruit  Cardiovascular: Normal rate, regular rhythm and normal heart sounds.   Pulmonary/Chest: Effort normal and breath sounds normal. No stridor. No respiratory distress. She has no rales.  Abdominal: Soft. Bowel sounds are normal. She exhibits no distension and no mass. There is no tenderness. There is no rebound and no guarding.  Musculoskeletal: She exhibits no tenderness or deformity.  Trace pedal edema  Neurological: She is alert and oriented to person, place, and time. She displays normal reflexes.  Skin: Skin is warm and dry. Capillary refill takes less than 2 seconds.  Psychiatric: She has a normal mood and affect. Her behavior is normal.  Nursing note and vitals reviewed.   ED Treatments / Results   Vitals:   09/25/16 2340  BP: (!) 155/94  Pulse: 92  Resp: 18  Temp: 99.8 F (37.7 C)   DIAGNOSTIC STUDIES: Oxygen Saturation is 100% on RA, normal by my interpretation.    COORDINATION OF CARE: 11:56 PM Discussed treatment plan with pt at bedside and pt agreed to plan.   Labs (all labs ordered are listed, but only abnormal results are displayed)  Results for orders placed or performed during the hospital encounter of 09/25/16  Urinalysis, Routine w reflex microscopic  Result Value Ref Range   Color, Urine YELLOW YELLOW   APPearance CLEAR CLEAR   Specific Gravity, Urine 1.017 1.005 - 1.030   pH 6.0 5.0 - 8.0   Glucose, UA NEGATIVE NEGATIVE mg/dL   Hgb urine dipstick NEGATIVE NEGATIVE   Bilirubin Urine NEGATIVE NEGATIVE   Ketones, ur NEGATIVE NEGATIVE mg/dL   Protein, ur NEGATIVE NEGATIVE mg/dL   Nitrite NEGATIVE NEGATIVE   Leukocytes, UA NEGATIVE NEGATIVE  Pregnancy, urine  Result Value Ref Range   Preg Test, Ur NEGATIVE NEGATIVE  Basic metabolic panel  Result Value Ref Range   Sodium 136 135 - 145 mmol/L   Potassium 3.3 (L) 3.5 - 5.1 mmol/L   Chloride 104 101 - 111 mmol/L   CO2 21 (L) 22 - 32 mmol/L     Glucose, Bld 100 (H) 65 - 99 mg/dL   BUN 9 6 - 20 mg/dL   Creatinine, Ser 1.61 0.44 - 1.00 mg/dL   Calcium 9.1 8.9 - 09.6 mg/dL   GFR calc non Af Amer >60 >60 mL/min   GFR calc Af Amer >60 >60 mL/min   Anion gap 11 5 - 15   US Venous Img Lower Bilateral  Result Date: 09/26/2016 CLINICAL DATA:  31 y/o F; bilateral lower extremity swelling for 1 week. EXAM: BILATERAL LOWER EXTREMITY VENOUS DOPPLER ULTRASOUND TECHNIQUE: Gray-scale sonography with graded compression, as well as color Doppler and duplex ultrasound were performed to evaluate the lower extremity deep venous systems from the level of the common femoral vein and including the common femoral, femoral, profunda femoral, popliteal and calf veins including the posterior tibial, peroneal and gastrocnemius veins when visible. The superficial great saphenous vein was  also interrogated. Spectral Doppler was utilized to evaluate flow at rest and with distal augmentation maneuvers in the common femoral, femoral and popliteal veins. COMPARISON:  None. FINDINGS: RIGHT LOWER EXTREMITY Common Femoral Vein: No evidence of thrombus. Normal compressibility, respiratory phasicity and response to augmentation. Saphenofemoral Junction: No evidence of thrombus. Normal compressibility and flow on color Doppler imaging. Profunda Femoral Vein: No evidence of thrombus. Normal compressibility and flow on color Doppler imaging. Femoral Vein: No evidence of thrombus. Normal compressibility, respiratory phasicity and response to augmentation. Popliteal Vein: No evidence of thrombus. Normal compressibility, respiratory phasicity and response to augmentation. Calf Veins: Peroneal vein poorly visualized. No evidence of thrombus. Normal compressibility and flow on color Doppler imaging. Superficial Great Saphenous Vein: No evidence of thrombus. Normal compressibility and flow on color Doppler imaging. Venous Reflux:  None. Other Findings:  None. LEFT LOWER EXTREMITY Common  Femoral Vein: No evidence of thrombus. Normal compressibility, respiratory phasicity and response to augmentation. Saphenofemoral Junction: No evidence of thrombus. Normal compressibility and flow on color Doppler imaging. Profunda Femoral Vein: No evidence of thrombus. Normal compressibility and flow on color Doppler imaging. Femoral Vein: No evidence of thrombus. Normal compressibility, respiratory phasicity and response to augmentation. Popliteal Vein: No evidence of thrombus. Normal compressibility, respiratory phasicity and response to augmentation. Calf Veins: Peroneal vein poorly visualized. No evidence of thrombus. Normal compressibility and flow on color Doppler imaging. Superficial Great Saphenous Vein: No evidence of thrombus. Normal compressibility and flow on color Doppler imaging. Venous Reflux:  None. Other Findings:  None. IMPRESSION: No evidence of DVT within either lower extremity. Electronically Signed   By: Mitzi Hansen M.D.   On: 09/26/2016 00:57    Procedures Procedures (including critical care time)    Extensive number of RX for xanax and Oxy IR 10 mg in the [facility] [place] dea database.    DEA Database:    09/08/2016 09/08/2016 OXYCODONE HCL 10 MG TABLET [id number] 90 30 0 0 [address] [place], [place] [id number] Linward Natal, Elmo Costlow, Tessah January 24, 1986 [address] [place], [place] [address] 03 45 09/08/2016 09/08/2016 ALPRAZOLAM 1 MG TABLET [id number] 90 30 0 0 [address] [place], [place] [id number] Linward Natal, [place] Alan, Nijae February 21, 1986 [address] [place], [place] [address] 03 0 08/03/2016 07/31/2016 OXYCODONE HCL 10 MG TABLET [id number] 90 30 0 0 [address] [place], [place] [id number] [facility] [place], [place] Vanantwerp, Yong [birth year] [address] [place], [place] [address] 03 45 08/03/2016 07/31/2016 ALPRAZOLAM 1 MG TABLET [id number] 90 30 0 0 [address] [place],  [place] [id number] [facility] [employer] [place], [place] Barkett, Jamesha [birth year] [address] [place], [place] [address] 03 UNK 07/07/2016 07/07/2016 OXYCODONE HCL 10 MG TABLET [id number] 90 30 0 0 [address] [place], [place] [id number] Linward Natal, [place] Potenza, Stacey Aug 07, 1985 [address] [place], [place] [address] 03 45 07/07/2016 07/07/2016 ALPRAZOLAM 1 MG TABLET [id number] 90 30 0 0 [address] [place], [place] [id number] Linward Natal, [place] Graff, Philomena [birth year] [address] [place], [place] [address] 03 UNK 05/17/2016 05/07/2016 ALPRAZOLAM 1 MG TABLET [id number] 90 30 0 0 [address] [place], [place] [id number] Linward Natal, [place] Norway, Naida 02-11-1986 [address] [place], [place] [address] 03 UNK 05/07/2016 05/07/2016 OXYCODONE HCL 10 MG TABLET [id number] 90 30 0 0 [address] [place], [place] [id number] Linward Natal, [place], Ravynn 03-24-1986 [address] [place], [place] [address] 03 45 05/02/2016 05/02/2016 CLONAZEPAM 1 MG TABLET [id number] 45 15 0 0 525266 [facility] W  MD [place], [place] [id number] Sandi [facility] [place] [place], [place] Deziel, Keishana 08-18-1985 [address] [place], [place] [address] 03 UNK 04/01/2016 04/01/2016 OXYCODONE HCL 10 MG TABLET [id number] 105 26 0 0 [address]31 [place] [id number] Lin[facility] NatalW[facility] [place], [place], Shahrzad 08-18-1985 [address] [place], [place] [address] 03 60.58 04/01/2016 04/01/2016 ALPRAZOLAM 1 MG TABLET [id number] 90 30 0 0 [address] [place] [place] [id number] Linward NatalW[facility] [place], [place]renOLL[place], Annalucia 08-18-1985 [address] [place], [place] [address] 03 UNK 03/25/2016 03/25/2016 TRAMADOL HCL 50 MG TABLET [id number] 12 7 0 0 513944 DAVANZO MICHAEL [place], [place] [id number] [facility] [employer] [place], [place] Ron, Lahela 08-18-1985 [address] [place], [place] [address] 03 8.57 02/28/2016 02/28/2016 OXYCODONE HCL 10 MG  TABLET [id number] 90 30 0 0 [address]1661442 [place] [id number] [facility] [place], [place], Henley 08-18-1985 [address] [place], [place] [address] 03 45 02/28/2016 02/28/2016 ALPRAZOLAM 1 MG TABLET  Final Clinical Impressions(s) / ED Diagnoses   Patient discharged with nurse Tresa EndoKelly and Almedia BallsSam.  Malana informed me prior to discharge patient now stating she is having panic attacks and needs meds for this.  She appears under the influence of substances.  She will not be getting benzodiazepines nor narcotics in the ED.  In the presence of both nurses EDP reviewed all patient's results, that there are no DVTs nor CHF on CXR and that the patient has normal blood work.  Instructions and follow up also reviewed with nurses present.    This is a 31 y.o. -year-old female presents with foot swelling. The patient is nontoxic-appearing on exam and vital signs are within normal limits.  I do not feel that diuretics are indicated there is only trace pedal edema and this is dependent and patient has been advised to elevate her legs wear compression stockings and follow up with her PMD.  EDP politely explained with scribe and Sue LushAndrea, nurse present that we do not do echocardiograms in the ED and if this is her concern she will need to follow up with both her cardiologist and PMD.  She was ruled out for DVT and chf in the ED>     I have reviewed the triage vital signs and the nursing notes. Pertinent labs & imaging results that were available during my care of the patient were reviewed by me and considered in my medical decision making (see chart for details). The patient is nontoxic-appearing on exam and vital signs are within normal limits. Return for fevers, chest pain with exertion, weakness, numbness, neck pain or stiffness, inability to make or understand speech or any concerns.    After history, exam, and medical workup I feel the patient has been appropriately medically screened and is safe for  discharge home. Pertinent diagnoses were discussed with the patient. Patient was given return precautions.    I personally performed the services described in this documentation, which was scribed in my presence. The recorded information has been reviewed and is accurate.      Keliah Harned, MD 09/26/16 Shirlee [id number]    Ronda Rajkumar, MD 09/26/16 [id number]

## 2016-09-25 NOTE — ED Triage Notes (Signed)
Patient states that she is here because she is having swelling in her bilateral legs, and bloating in her abdominal area. Patient akward about rating her pain "because I am not here for drugs, and I do not want anyone to think that". The patient reports that she had this swelling on the third "but you guys didn't do anything" - Patient reports that she was told that she had congestive heart failure and wants to be checked for that

## 2016-09-26 ENCOUNTER — Emergency Department (HOSPITAL_BASED_OUTPATIENT_CLINIC_OR_DEPARTMENT_OTHER): Payer: Medicare Other

## 2016-09-26 ENCOUNTER — Encounter (HOSPITAL_BASED_OUTPATIENT_CLINIC_OR_DEPARTMENT_OTHER): Payer: Self-pay | Admitting: Emergency Medicine

## 2016-09-26 DIAGNOSIS — R600 Localized edema: Secondary | ICD-10-CM | POA: Diagnosis not present

## 2016-09-26 LAB — BASIC METABOLIC PANEL
Anion gap: 11 (ref 5–15)
BUN: 9 mg/dL (ref 6–20)
CALCIUM: 9.1 mg/dL (ref 8.9–10.3)
CHLORIDE: 104 mmol/L (ref 101–111)
CO2: 21 mmol/L — AB (ref 22–32)
CREATININE: 0.6 mg/dL (ref 0.44–1.00)
GFR calc Af Amer: >60 mL/min (ref >60)
GFR calc non Af Amer: >60 mL/min (ref >60)
GLUCOSE: 100 mg/dL — AB (ref 65–99)
Potassium: 3.3 mmol/L — ABNORMAL LOW (ref 3.5–5.1)
Sodium: 136 mmol/L (ref 135–145)

## 2016-09-26 LAB — URINALYSIS, ROUTINE W REFLEX MICROSCOPIC
BILIRUBIN URINE: NEGATIVE
GLUCOSE, UA: NEGATIVE mg/dL
HGB URINE DIPSTICK: NEGATIVE
KETONES UR: NEGATIVE mg/dL
Leukocytes, UA: NEGATIVE
Nitrite: NEGATIVE
PH: 6 (ref 5.0–8.0)
PROTEIN: NEGATIVE mg/dL
Specific Gravity, Urine: 1.017 (ref 1.005–1.030)

## 2016-09-26 LAB — PREGNANCY, URINE: Preg Test, Ur: NEGATIVE

## 2016-09-26 NOTE — ED Notes (Signed)
Pt appears to be very sleeping and requesting iv drugs and blood work, states she was here on 4/3 and " nothing was done , no blood work, nothing" Pt requesting echo be done, MD instructed pt that we are not capable of doing those and she will refer to cardiology for that.

## 2016-09-26 NOTE — ED Notes (Addendum)
Patient called RN into the room again to ask about her anxiety, patient reports that she take xanax for her anxiety but she is out of it. Reports that she can take it up to 3 times a day, last taken last night - patient made aware that her vital signs are WNL- Patient asked if she drove,  states that she can get a ride if she needs one  MD aware

## 2016-09-26 NOTE — ED Notes (Signed)
ED Provider at bedside. 

## 2016-09-26 NOTE — ED Notes (Signed)
Patient transported to Ultrasound 

## 2016-09-26 NOTE — ED Notes (Signed)
EDP and Charge nurse at the bedside. When walking into the room the patient is leaning over the sink in room, emesis bag in bed empty. EDP explained results and home care. Patient acknowledges these instructions.

## 2017-11-04 IMAGING — US US EXTREM LOW VENOUS BILAT
1 series · 13 of 24 positions shown · non-contrast
Comparison: None.

CLINICAL DATA: 31 y/o F; bilateral lower extremity swelling for 1
week.



[Series 1: us extrem low venous bilat · 0.08mm/px · 13 of 46 slices shown]
[im 1/46]
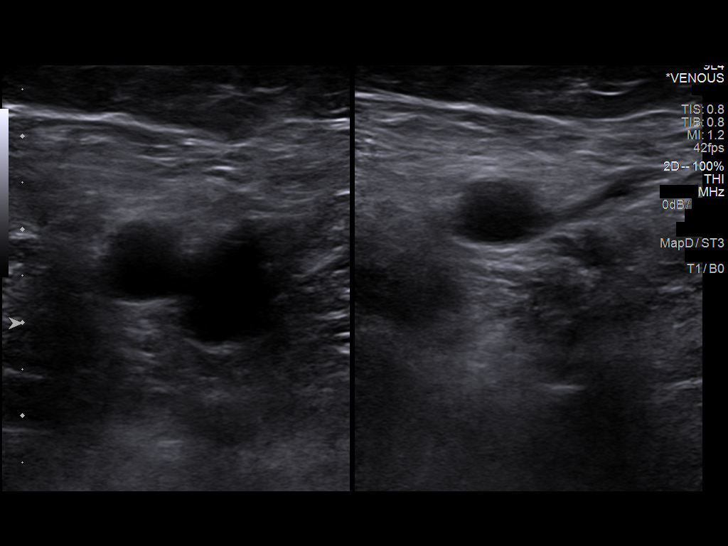
[im 4/46]
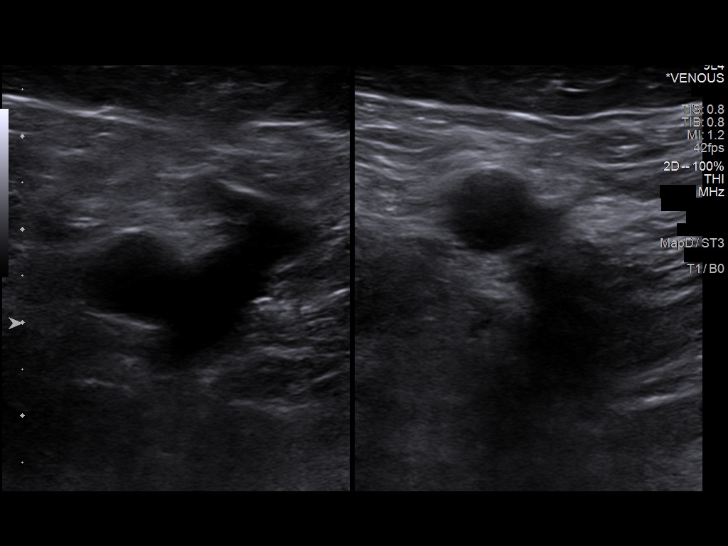
[im 8/46]
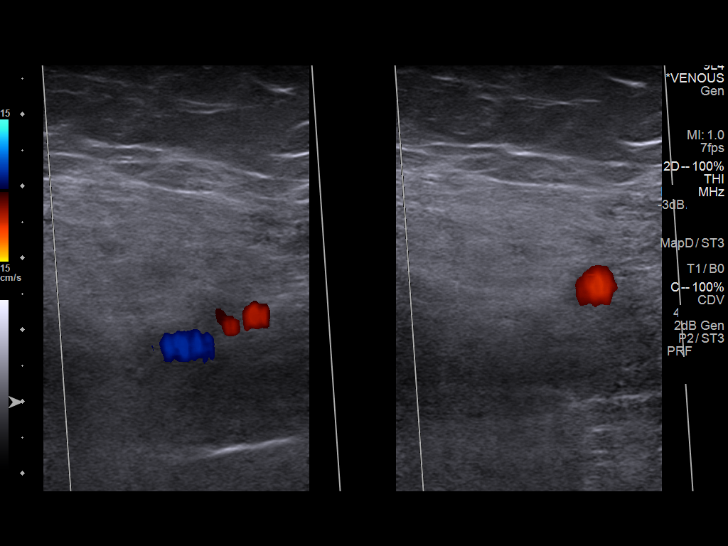
[im 12/46]
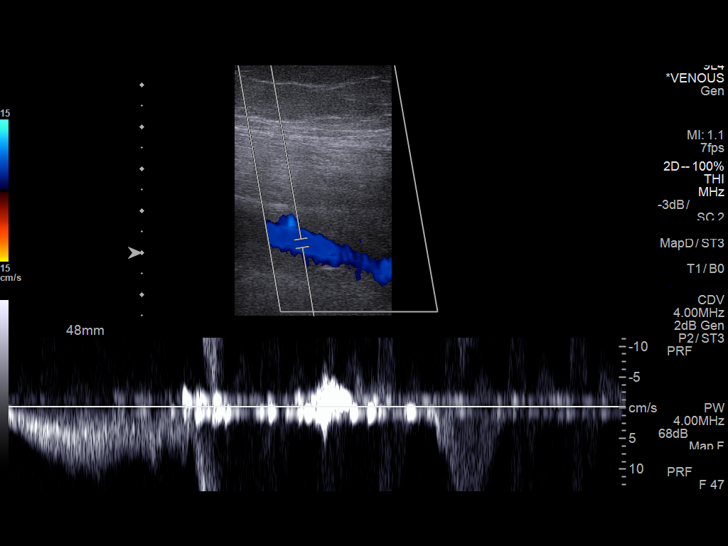
[im 16/46]
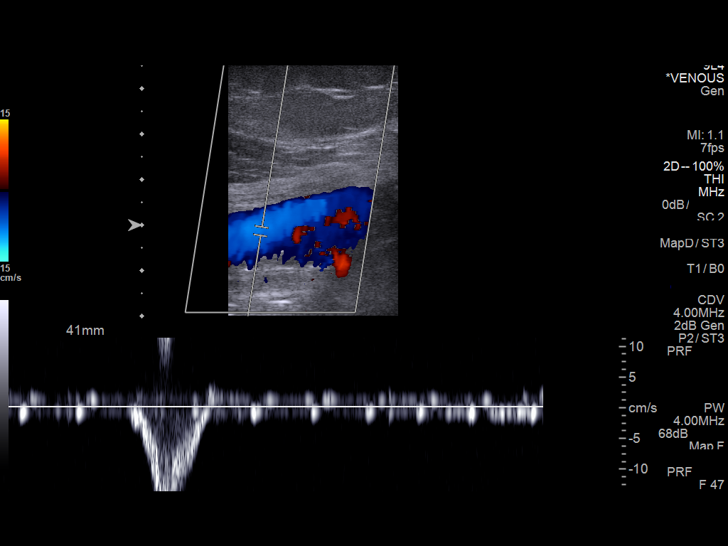
[im 20/46]
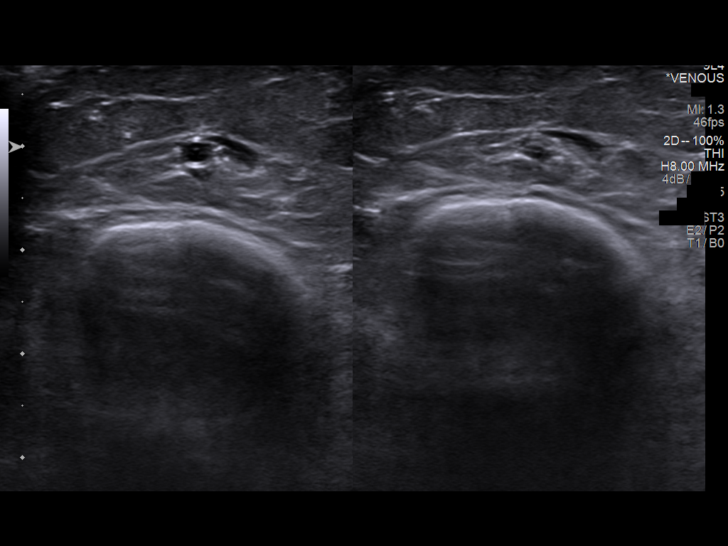
[im 24/46]
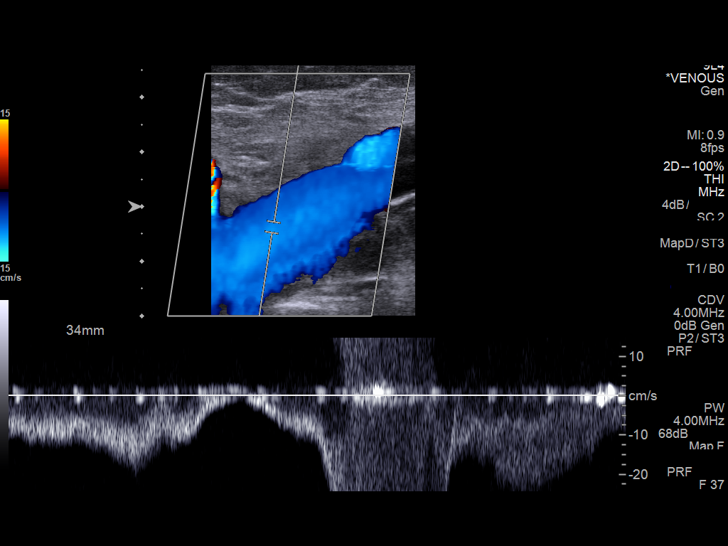
[im 26/46]
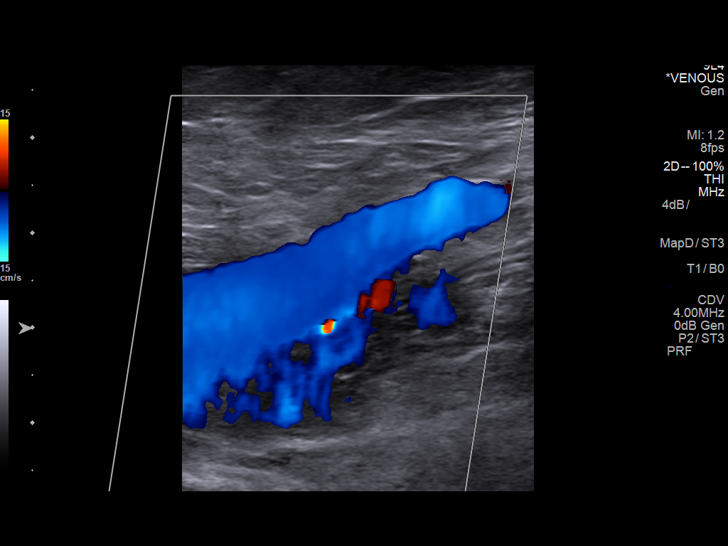
[im 30/46]
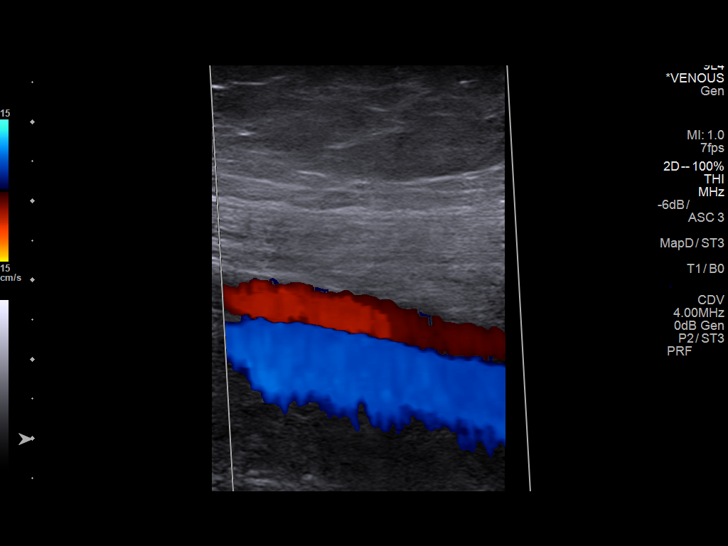
[im 34/46]
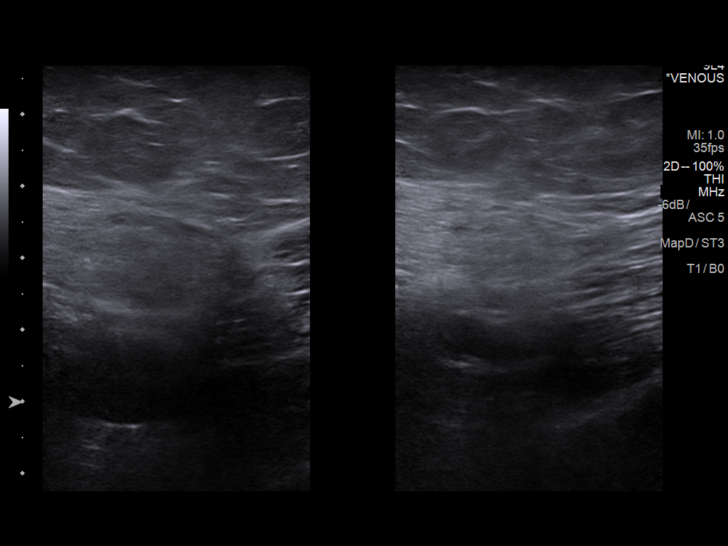
[im 38/46]
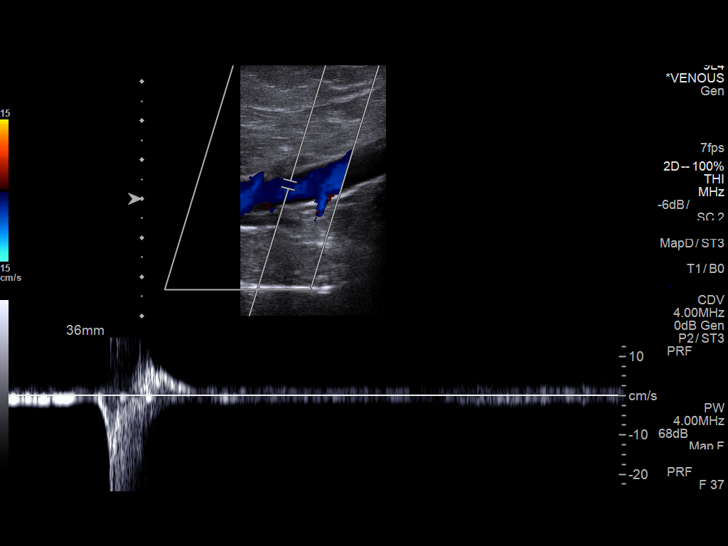
[im 42/46]
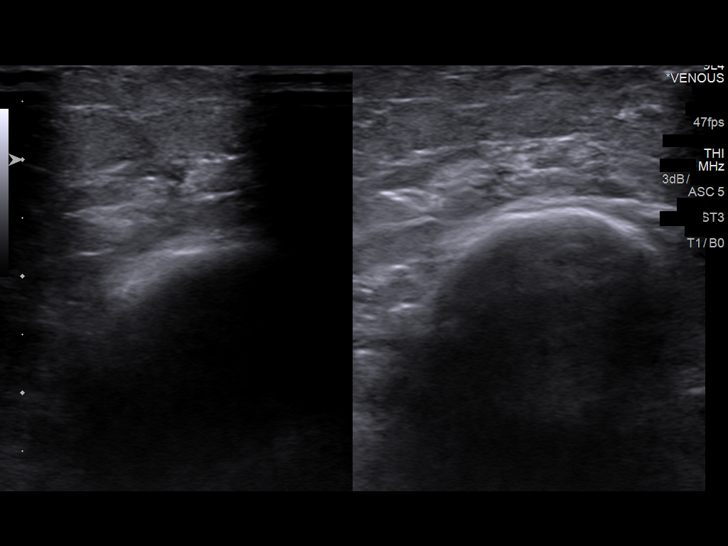
[im 46/46]
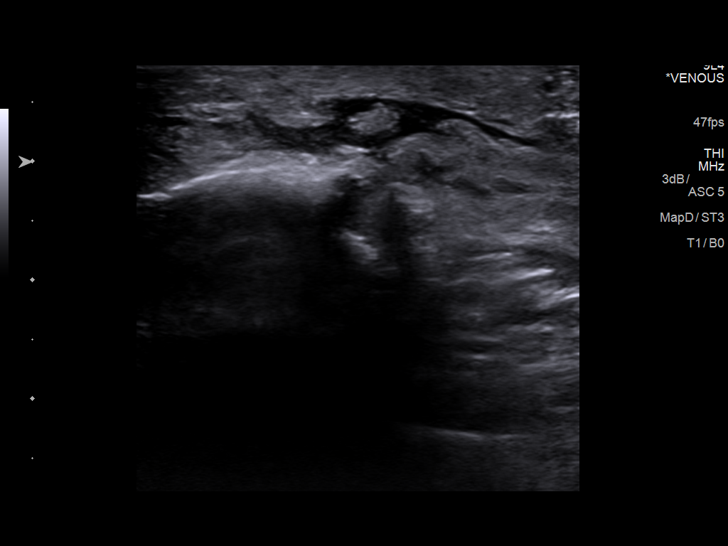

[13 of 24 positions shown; findings below may reference images not displayed]

FINDINGS: RIGHT LOWER EXTREMITY

Common Femoral Vein: No evidence of thrombus. Normal
compressibility, respiratory phasicity and response to augmentation.

Saphenofemoral Junction: No evidence of thrombus. Normal
compressibility and flow on color Doppler imaging.

Profunda Femoral Vein: No evidence of thrombus. Normal
compressibility and flow on color Doppler imaging.

Femoral Vein: No evidence of thrombus. Normal compressibility,
respiratory phasicity and response to augmentation.

Popliteal Vein: No evidence of thrombus. Normal compressibility,
respiratory phasicity and response to augmentation.

Calf Veins: Peroneal vein poorly visualized. No evidence of
thrombus. Normal compressibility and flow on color Doppler imaging.

Superficial Great Saphenous Vein: No evidence of thrombus. Normal
compressibility and flow on color Doppler imaging.

Venous Reflux:  None.

Other Findings:  None.

LEFT LOWER EXTREMITY

Common Femoral Vein: No evidence of thrombus. Normal
compressibility, respiratory phasicity and response to augmentation.

Saphenofemoral Junction: No evidence of thrombus. Normal
compressibility and flow on color Doppler imaging.

Profunda Femoral Vein: No evidence of thrombus. Normal
compressibility and flow on color Doppler imaging.

Femoral Vein: No evidence of thrombus. Normal compressibility,
respiratory phasicity and response to augmentation.

Popliteal Vein: No evidence of thrombus. Normal compressibility,
respiratory phasicity and response to augmentation.

Calf Veins: Peroneal vein poorly visualized. No evidence of
thrombus. Normal compressibility and flow on color Doppler imaging.

Superficial Great Saphenous Vein: No evidence of thrombus. Normal
compressibility and flow on color Doppler imaging.

Venous Reflux:  None.

Other Findings:  None.
IMPRESSION: No evidence of DVT within either lower extremity.

By: Mison Homaira M.D.

## 2020-05-17 ENCOUNTER — Ambulatory Visit: Payer: Self-pay | Admitting: Podiatry

## 2020-06-08 ENCOUNTER — Other Ambulatory Visit: Payer: Self-pay | Admitting: Internal Medicine

## 2020-10-16 ENCOUNTER — Inpatient Hospital Stay (HOSPITAL_COMMUNITY)
Admission: AD | Admit: 2020-10-16 | Discharge: 2020-10-22 | DRG: 885 | Disposition: A | Discharge status: Home / Self Care | Payer: Medicare Other | Source: Intra-hospital | Attending: Psychiatry | Admitting: Psychiatry

## 2020-10-16 ENCOUNTER — Encounter (HOSPITAL_COMMUNITY): Payer: Self-pay | Admitting: Psychiatry

## 2020-10-16 ENCOUNTER — Other Ambulatory Visit: Payer: Self-pay

## 2020-10-16 DIAGNOSIS — Z7984 Long term (current) use of oral hypoglycemic drugs: Secondary | ICD-10-CM | POA: Diagnosis not present

## 2020-10-16 DIAGNOSIS — Z9152 Personal history of nonsuicidal self-harm: Secondary | ICD-10-CM | POA: Diagnosis not present

## 2020-10-16 DIAGNOSIS — F4310 Post-traumatic stress disorder, unspecified: Secondary | ICD-10-CM

## 2020-10-16 DIAGNOSIS — F17210 Nicotine dependence, cigarettes, uncomplicated: Secondary | ICD-10-CM | POA: Diagnosis present

## 2020-10-16 DIAGNOSIS — E119 Type 2 diabetes mellitus without complications: Secondary | ICD-10-CM | POA: Diagnosis present

## 2020-10-16 DIAGNOSIS — I10 Essential (primary) hypertension: Secondary | ICD-10-CM | POA: Diagnosis present

## 2020-10-16 DIAGNOSIS — F39 Unspecified mood [affective] disorder: Secondary | ICD-10-CM | POA: Diagnosis present

## 2020-10-16 DIAGNOSIS — Z9151 Personal history of suicidal behavior: Secondary | ICD-10-CM

## 2020-10-16 DIAGNOSIS — F329 Major depressive disorder, single episode, unspecified: Secondary | ICD-10-CM | POA: Insufficient documentation

## 2020-10-16 DIAGNOSIS — F332 Major depressive disorder, recurrent severe without psychotic features: Principal | ICD-10-CM | POA: Diagnosis present

## 2020-10-16 DIAGNOSIS — R45851 Suicidal ideations: Secondary | ICD-10-CM | POA: Diagnosis present

## 2020-10-16 DIAGNOSIS — G4700 Insomnia, unspecified: Secondary | ICD-10-CM | POA: Diagnosis present

## 2020-10-16 DIAGNOSIS — F419 Anxiety disorder, unspecified: Secondary | ICD-10-CM | POA: Diagnosis present

## 2020-10-16 MED ORDER — MAGNESIUM HYDROXIDE 400 MG/5ML PO SUSP: 30.0000 mL | Freq: Every day | ORAL | Status: DC | PRN

## 2020-10-16 MED ORDER — ALUM & MAG HYDROXIDE-SIMETH 200-200-20 MG/5ML PO SUSP: 30.0000 mL | ORAL | Status: DC | PRN

## 2020-10-16 MED ORDER — HYDROXYZINE HCL 25 MG PO TABS
25.0000 mg | ORAL_TABLET | Freq: Three times a day (TID) | ORAL | Status: DC | PRN
  Administered 2020-10-16 – 2020-10-22 (×2): 25 mg via ORAL
  Filled 2020-10-16 (×3): qty 1

## 2020-10-16 MED ORDER — TRAZODONE HCL 50 MG PO TABS
50.0000 mg | ORAL_TABLET | Freq: Every evening | ORAL | Status: DC | PRN
  Administered 2020-10-16 – 2020-10-21 (×5): 50 mg via ORAL
  Filled 2020-10-16 (×5): qty 1

## 2020-10-16 MED ORDER — ACETAMINOPHEN 325 MG PO TABS
650.0000 mg | ORAL_TABLET | Freq: Four times a day (QID) | ORAL | Status: DC | PRN
  Administered 2020-10-19 – 2020-10-21 (×3): 650 mg via ORAL
  Filled 2020-10-16 (×3): qty 2

## 2020-10-16 MED ORDER — NICOTINE 21 MG/24HR TD PT24
21.0000 mg | MEDICATED_PATCH | Freq: Every day | TRANSDERMAL | Status: DC
  Administered 2020-10-17 – 2020-10-22 (×6): 21 mg via TRANSDERMAL
  Filled 2020-10-16 (×8): qty 1
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
## 2020-10-16 NOTE — Progress Notes (Signed)
Psychoeducational Group Note  Date:  10/16/2020 Time:  2000  Group Topic/Focus:  wrap up group  Participation Level: Did Not Attend  Participation Quality:  Not Applicable  Affect:  Not Applicable  Cognitive:  Not Applicable  Insight:  Not Applicable  Engagement in Group: Not Applicable  Additional Comments:  Did not attend.   Marcille Buffy 10/16/2020, 8:59 PM
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
## 2020-10-17 ENCOUNTER — Encounter (HOSPITAL_COMMUNITY): Payer: Self-pay | Admitting: Psychiatry

## 2020-10-17 DIAGNOSIS — F39 Unspecified mood [affective] disorder: Secondary | ICD-10-CM

## 2020-10-17 DIAGNOSIS — F419 Anxiety disorder, unspecified: Secondary | ICD-10-CM

## 2020-10-17 DIAGNOSIS — F332 Major depressive disorder, recurrent severe without psychotic features: Secondary | ICD-10-CM

## 2020-10-17 DIAGNOSIS — F4310 Post-traumatic stress disorder, unspecified: Secondary | ICD-10-CM

## 2020-10-17 HISTORY — DX: Post-traumatic stress disorder, unspecified: F43.10

## 2020-10-17 HISTORY — DX: Major depressive disorder, recurrent severe without psychotic features: F33.2

## 2020-10-17 HISTORY — DX: Anxiety disorder, unspecified: F41.9

## 2020-10-17 HISTORY — DX: Unspecified mood (affective) disorder: F39

## 2020-10-17 MED ORDER — ONDANSETRON 4 MG PO TBDP: 4.0000 mg | ORAL_TABLET | Freq: Three times a day (TID) | ORAL | Status: DC | PRN

## 2020-10-17 MED ORDER — ALPRAZOLAM 0.5 MG PO TABS
1.0000 mg | ORAL_TABLET | Freq: Once | ORAL | Status: AC
  Administered 2020-10-17: 1 mg via ORAL

## 2020-10-17 MED ORDER — SERTRALINE HCL 50 MG PO TABS
50.0000 mg | ORAL_TABLET | Freq: Every day | ORAL | Status: DC
  Administered 2020-10-17: 50 mg via ORAL
  Filled 2020-10-17 (×5): qty 1

## 2020-10-17 MED ORDER — ALPRAZOLAM 0.5 MG PO TABS
0.5000 mg | ORAL_TABLET | Freq: Every day | ORAL | Status: DC
  Administered 2020-10-18 – 2020-10-19 (×2): 0.5 mg via ORAL
  Filled 2020-10-17 (×2): qty 1

## 2020-10-17 MED ORDER — BACLOFEN 10 MG PO TABS: 10.0000 mg | ORAL_TABLET | Freq: Three times a day (TID) | ORAL | Status: DC | PRN

## 2020-10-17 MED ORDER — POTASSIUM CHLORIDE CRYS ER 10 MEQ PO TBCR
10.0000 meq | EXTENDED_RELEASE_TABLET | Freq: Two times a day (BID) | ORAL | Status: AC
  Administered 2020-10-17 (×2): 10 meq via ORAL
  Filled 2020-10-17 (×2): qty 1

## 2020-10-17 MED ORDER — BUSPIRONE HCL 10 MG PO TABS
10.0000 mg | ORAL_TABLET | Freq: Three times a day (TID) | ORAL | Status: DC
  Administered 2020-10-17 – 2020-10-22 (×15): 10 mg via ORAL
  Filled 2020-10-17 (×22): qty 1

## 2020-10-17 MED ORDER — METFORMIN HCL ER 500 MG PO TB24
500.0000 mg | ORAL_TABLET | Freq: Every day | ORAL | Status: DC
  Administered 2020-10-17 – 2020-10-21 (×6): 500 mg via ORAL
  Filled 2020-10-17 (×7): qty 1

## 2020-10-17 MED ORDER — CHLORTHALIDONE 25 MG PO TABS
25.0000 mg | ORAL_TABLET | Freq: Every day | ORAL | Status: DC
  Administered 2020-10-17 – 2020-10-22 (×6): 25 mg via ORAL
  Filled 2020-10-17 (×8): qty 1

## 2020-10-17 MED ORDER — LOPERAMIDE HCL 2 MG PO CAPS: 2.0000 mg | ORAL_CAPSULE | ORAL | Status: AC | PRN

## 2020-10-17 MED ORDER — LORAZEPAM 1 MG PO TABS: 1.0000 mg | ORAL_TABLET | Freq: Four times a day (QID) | ORAL | Status: AC | PRN

## 2020-10-17 MED ORDER — ATENOLOL 100 MG PO TABS
100.0000 mg | ORAL_TABLET | Freq: Every day | ORAL | Status: DC
  Administered 2020-10-17 – 2020-10-22 (×6): 100 mg via ORAL
  Filled 2020-10-17 (×8): qty 1

## 2020-10-17 MED ORDER — ALPRAZOLAM 0.5 MG PO TABS
1.0000 mg | ORAL_TABLET | Freq: Every evening | ORAL | Status: DC | PRN
  Administered 2020-10-17 – 2020-10-21 (×5): 1 mg via ORAL
  Filled 2020-10-17 (×2): qty 4
  Filled 2020-10-17 (×3): qty 2
  Filled 2020-10-17 (×2): qty 4

## 2020-10-17 MED ORDER — THIAMINE HCL 100 MG PO TABS
100.0000 mg | ORAL_TABLET | Freq: Every day | ORAL | Status: DC
  Administered 2020-10-18 – 2020-10-22 (×5): 100 mg via ORAL
  Filled 2020-10-17 (×8): qty 1

## 2020-10-17 MED ORDER — ADULT MULTIVITAMIN W/MINERALS CH
1.0000 | ORAL_TABLET | Freq: Every day | ORAL | Status: DC
  Administered 2020-10-17 – 2020-10-22 (×6): 1 via ORAL
  Filled 2020-10-17 (×9): qty 1

## 2020-10-17 NOTE — [facility] Group Notes (Signed)
Topic: Emotions  Due to the acuity and complex discharge plans, group was not held. Patient was provided therapeutic worksheets and asked to meet with CSW as needed.  Korra Christine, LCSWA Clinicial Social Worker [facility] 

## 2020-10-17 NOTE — Tx Team (Signed)
Interdisciplinary Treatment and Diagnostic Plan Update  10/17/2020 Time of Session: 9:15am  Naiara Lombardozzi MRN: [id number]  Principal Diagnosis: <principal problem not specified>  Secondary Diagnoses: Active Problems:   MDD (major depressive disorder)   Current Medications:  Current Facility-Administered Medications  Medication Dose Route Frequency Provider Last Rate Last Admin  . acetaminophen (TYLENOL) tablet 650 mg  650 mg Oral Q6H PRN White, Patrice L, NP      . ALPRAZolam Duanne Moron) tablet 1 mg  1 mg Oral QHS PRN Sharma Covert, MD      . alum & mag hydroxide-simeth (MAALOX/MYLANTA) 200-200-20 MG/5ML suspension 30 mL  30 mL Oral Q4H PRN White, Patrice L, NP      . atenolol (TENORMIN) tablet 100 mg  100 mg Oral Daily Sharma Covert, MD   100 mg at 10/17/20 1122  . baclofen (LIORESAL) tablet 10 mg  10 mg Oral TID PRN Sharma Covert, MD      . busPIRone (BUSPAR) tablet 10 mg  10 mg Oral TID Sharma Covert, MD   10 mg at 10/17/20 1115  . chlorthalidone (HYGROTON) tablet 25 mg  25 mg Oral Daily Sharma Covert, MD   25 mg at 10/17/20 1114  . hydrOXYzine (ATARAX/VISTARIL) tablet 25 mg  25 mg Oral TID PRN Darrol Austen Oyster L, NP   25 mg at 10/16/20 2127  . magnesium hydroxide (MILK OF MAGNESIA) suspension 30 mL  30 mL Oral Daily PRN White, Patrice L, NP      . metFORMIN (GLUCOPHAGE-XR) 24 hr tablet 500 mg  500 mg Oral QHS Sharma Covert, MD   500 mg at 10/17/20 1116  . nicotine (NICODERM CQ - dosed in mg/24 hours) patch 21 mg  21 mg Transdermal Daily White, Patrice L, NP   21 mg at 10/17/20 1127  . ondansetron (ZOFRAN-ODT) disintegrating tablet 4 mg  4 mg Oral Q8H PRN Sharma Covert, MD      . potassium chloride (KLOR-CON) CR tablet 10 mEq  10 mEq Oral BID Sharma Covert, MD   10 mEq at 10/17/20 1115  . sertraline (ZOLOFT) tablet 50 mg  50 mg Oral Daily Sharma Covert, MD   50 mg at 10/17/20 1116  . traZODone (DESYREL) tablet 50 mg  50 mg Oral QHS PRN White,  Patrice L, NP   50 mg at 10/16/20 2127   PTA Medications: Medications Prior to Admission  Medication Sig Dispense Refill Last Dose  . lithium carbonate 300 MG capsule Take 1 capsule by mouth in the morning, at noon, and at bedtime.     . ALPRAZolam (XANAX) 1 MG tablet 1 mg 2 (two) times daily.     . busPIRone (BUSPAR) 10 MG tablet Take 10 mg by mouth 3 (three) times daily.       Patient Stressors: Medication change or noncompliance Traumatic event  Patient Strengths: Ability for insight Capable of independent living Communication skills Motivation for treatment/growth Supportive family/friends  Treatment Modalities: Medication Management, Group therapy, Case management,  1 to 1 session with clinician, Psychoeducation, Recreational therapy.   Physician Treatment Plan for Primary Diagnosis: <principal problem not specified> Long Term Goal(s):     Short Term Goals:    Medication Management: Evaluate patient's response, side effects, and tolerance of medication regimen.  Therapeutic Interventions: 1 to 1 sessions, Unit Group sessions and Medication administration.  Evaluation of Outcomes: Not Met  Physician Treatment Plan for Secondary Diagnosis: Active Problems:   MDD (major depressive disorder)  Long Term Goal(s):     Short Term Goals:       Medication Management: Evaluate patient's response, side effects, and tolerance of medication regimen.  Therapeutic Interventions: 1 to 1 sessions, Unit Group sessions and Medication administration.  Evaluation of Outcomes: Not Met   RN Treatment Plan for Primary Diagnosis: <principal problem not specified> Long Term Goal(s): Knowledge of disease and therapeutic regimen to maintain health will improve  Short Term Goals: Ability to remain free from injury will improve, Ability to verbalize frustration and anger appropriately will improve, Ability to demonstrate self-control, Ability to participate in decision making will improve,  Ability to verbalize feelings will improve, Ability to disclose and discuss suicidal ideas and Ability to identify and develop effective coping behaviors will improve  Medication Management: RN will administer medications as ordered by provider, will assess and evaluate patient's response and provide education to patient for prescribed medication. RN will report any adverse and/or side effects to prescribing provider.  Therapeutic Interventions: 1 on 1 counseling sessions, Psychoeducation, Medication administration, Evaluate responses to treatment, Monitor vital signs and CBGs as ordered, Perform/monitor CIWA, COWS, AIMS and Fall Risk screenings as ordered, Perform wound care treatments as ordered.  Evaluation of Outcomes: Not Met   LCSW Treatment Plan for Primary Diagnosis: <principal problem not specified> Long Term Goal(s): Safe transition to appropriate next level of care at discharge, Engage patient in therapeutic group addressing interpersonal concerns.  Short Term Goals: Engage patient in aftercare planning with referrals and resources, Increase social support, Increase ability to appropriately verbalize feelings, Increase emotional regulation, Facilitate acceptance of mental health diagnosis and concerns, Identify triggers associated with mental health/substance abuse issues and Increase skills for wellness and recovery  Therapeutic Interventions: Assess for all discharge needs, 1 to 1 time with Social worker, Explore available resources and support systems, Assess for adequacy in community support network, Educate family and significant other(s) on suicide prevention, Complete Psychosocial Assessment, Interpersonal group therapy.  Evaluation of Outcomes: Not Met   Progress in Treatment: Attending groups: No. Participating in groups: No. Taking medication as prescribed: Yes. Toleration medication: Yes. Family/Significant other contact made: Yes, individual(s) contacted:  If consents  are provided  Patient understands diagnosis: No. Discussing patient identified problems/goals with staff: Yes. Medical problems stabilized or resolved: Yes. Denies suicidal/homicidal ideation: Yes. Issues/concerns per patient self-inventory: No.   New problem(s) identified: No, Describe:  None   New Short Term/Long Term Goal(s): medication stabilization, elimination of SI thoughts, development of comprehensive mental wellness plan.   Patient Goals: "I just want my Xanax"   Discharge Plan or Barriers: Patient recently admitted. CSW will continue to follow and assess for appropriate referrals and possible discharge planning.   Reason for Continuation of Hospitalization: Anxiety Depression Medication stabilization Suicidal ideation  Estimated Length of Stay: 3 to 5 days   Attendees: Patient: Tina Mcdaniel  10/17/2020  Physician: Ethelene Browns, MD  10/17/2020   Nursing:  10/17/2020   RN Care Manager: 10/17/2020   Social Worker: Verdis Frederickson, [place]  10/17/2020   Recreational Therapist:  10/17/2020   Other:  10/17/2020   Other:  10/17/2020   Other: 10/17/2020     Scribe for Treatment Team: Darleen Crocker, [place] 10/17/2020 11:51 AM
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
## 2020-10-17 NOTE — Progress Notes (Signed)
Recreation Therapy Notes  Date: 6.1.22 Time: 0930 Location: [address] Dayroom  Group Topic: Stress Management   Goal Area(s) Addresses:  Patient will actively participate in stress management techniques presented during session.  Patient will successfully identify benefit of practicing stress management post d/c.   Intervention: Guided exercise with ambient sound and script  Activity :Guided Imagery  LRT read a beach visualization script to patients.  Patients were asked to participate in the technique introduced during introduction. Patients were given suggestions of ways to access scripts post d/c and encouraged to explore Youtube and other apps available on smartphones, tablets, and computers.   Education:  Stress Management, Discharge Planning.   Education Outcome: Acknowledges education  Clinical Observations/Feedback: Patient did not attend group session.   Caroll Rancher, LRT/CTRS         Caroll Rancher A 10/17/2020 12:39 PM

## 2020-10-17 NOTE — Progress Notes (Signed)
Patient in bed awake, guarded and irritable. Reports that she will not take any medications. I want my xanax, that's all". Wants to talk to MD to discuss medication regimen.

## 2020-10-17 NOTE — Progress Notes (Signed)
This provider attempted to meet with patient to review home medications. Patient noted to be asleep.

## 2020-10-17 NOTE — [facility] Group Notes (Signed)
The focus of this group is to help patients establish daily goals to achieve during treatment and discuss how the patient can incorporate goal setting into their daily lives to aide in recovery.  Pt did not attend group 

## 2020-10-17 NOTE — Progress Notes (Signed)
[facility] Group Notes:  (Nursing/MHT/Case Management/Adjunct)  Date:  10/17/2020  Time:  11:57 PM  Type of Therapy:  wrap up group  Participation Level:  Active  Participation Quality:  Appropriate and Sharing  Affect:  Appropriate  Cognitive:  Alert and Appropriate  Insight:  Appropriate  Engagement in Group:  Engaged  Modes of Intervention:  Problem-solving  Summary of Progress/Problems: Tina Mcdaniel shared how today started off bad because of her anxiety.  She is expecting tomorrow to be a better day.   [name] 10/17/2020, 11:57 PM
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
## 2020-10-17 NOTE — [facility] Counselor (Signed)
CSW attempted to complete PSA w/ pt but pt declined at this time.   Fredirick Lathe, LCSWA Clinicial Social Worker [employer]

## 2020-10-17 NOTE — Plan of Care (Signed)
Patient was more visible in the milieu toward the afternoon. More cooperative reporting improvement in mood.

## 2020-10-18 DIAGNOSIS — F332 Major depressive disorder, recurrent severe without psychotic features: Principal | ICD-10-CM

## 2020-10-18 LAB — CBC WITH DIFFERENTIAL/PLATELET
Abs Immature Granulocytes: 0 10*3/uL (ref 0.00–0.07)
Basophils Absolute: 0 10*3/uL (ref 0.0–0.1)
Basophils Relative: 0 %
Eosinophils Absolute: 0.2 10*3/uL (ref 0.0–0.5)
Eosinophils Relative: 3 %
HCT: 42.8 % (ref 36.0–46.0)
Hemoglobin: 14.3 g/dL (ref 12.0–15.0)
Immature Granulocytes: 0 %
Lymphocytes Relative: 60 %
Lymphs Abs: 4.1 10*3/uL — ABNORMAL HIGH (ref 0.7–4.0)
MCH: 28.9 pg (ref 26.0–34.0)
MCHC: 33.4 g/dL (ref 30.0–36.0)
MCV: 86.5 fL (ref 80.0–100.0)
Monocytes Absolute: 0.6 10*3/uL (ref 0.1–1.0)
Monocytes Relative: 8 %
Neutro Abs: 2 10*3/uL (ref 1.7–7.7)
Neutrophils Relative %: 29 %
Platelets: 293 10*3/uL (ref 150–400)
RBC: 4.95 MIL/uL (ref 3.87–5.11)
RDW: 13.7 % (ref 11.5–15.5)
WBC: 6.8 10*3/uL (ref 4.0–10.5)
nRBC: 0 % (ref 0.0–0.2)

## 2020-10-18 LAB — COMPREHENSIVE METABOLIC PANEL
ALT: 25 U/L (ref 0–44)
AST: 18 U/L (ref 15–41)
Albumin: 4.5 g/dL (ref 3.5–5.0)
Alkaline Phosphatase: 52 U/L (ref 38–126)
Anion gap: 11 (ref 5–15)
BUN: 9 mg/dL (ref 6–20)
CO2: 23 mmol/L (ref 22–32)
Calcium: 10.1 mg/dL (ref 8.9–10.3)
Chloride: 104 mmol/L (ref 98–111)
Creatinine, Ser: 0.75 mg/dL (ref 0.44–1.00)
GFR, Estimated: >60 mL/min (ref >60)
Glucose, Bld: 88 mg/dL (ref 70–99)
Potassium: 3.8 mmol/L (ref 3.5–5.1)
Sodium: 138 mmol/L (ref 135–145)
Total Bilirubin: 0.4 mg/dL (ref 0.3–1.2)
Total Protein: 7.9 g/dL (ref 6.5–8.1)

## 2020-10-18 LAB — LIPID PANEL
Cholesterol: 285 mg/dL — ABNORMAL HIGH (ref 0–200)
HDL: 48 mg/dL (ref >40)
LDL Cholesterol: 200 mg/dL — ABNORMAL HIGH (ref 0–99)
Total CHOL/HDL Ratio: 5.9 RATIO
Triglycerides: 185 mg/dL — ABNORMAL HIGH (ref <150)
VLDL: 37 mg/dL (ref 0–40)

## 2020-10-18 LAB — GLUCOSE, CAPILLARY: Glucose-Capillary: 92 mg/dL (ref 70–99)

## 2020-10-18 LAB — TSH: TSH: 2.81 u[IU]/mL (ref 0.350–4.500)

## 2020-10-18 LAB — T4, FREE: Free T4: 0.64 ng/dL (ref 0.61–1.12)

## 2020-10-18 NOTE — Progress Notes (Signed)
   10/18/20 1100  Psych Admission Type (Psych Patients Only)  Admission Status Voluntary  Psychosocial Assessment  Patient Complaints Irritability  Eye Contact Fair  Facial Expression Anxious  Affect Anxious  Speech Logical/coherent  Interaction Assertive  Motor Activity Restless  Appearance/Hygiene Unremarkable  Behavior Characteristics Irritable  Mood Depressed  Thought Process  Coherency WDL  Content WDL  Delusions Other (Comment) (None)  Perception WDL  Hallucination None reported or observed  Judgment Limited  Confusion None  Danger to Self  Current suicidal ideation? Denies  Self-Injurious Behavior No self-injurious ideation or behavior indicators observed or expressed   Agreement Not to Harm Self Yes  Description of Agreement verbal  Danger to Others  Danger to Others None reported or observed

## 2020-10-18 NOTE — Plan of Care (Signed)
  Problem: Coping: Goal: Coping ability will improve Outcome: Progressing   Problem: Medication: Goal: Compliance with prescribed medication regimen will improve Outcome: Progressing   Problem: Self-Concept: Goal: Ability to disclose and discuss suicidal ideas will improve Outcome: Progressing Goal: Will verbalize positive feelings about self Outcome: Progressing   Problem: Activity: Goal: Interest or engagement in activities will improve Outcome: Progressing   Problem: Coping: Goal: Ability to verbalize frustrations and anger appropriately will improve Outcome: Progressing

## 2020-10-18 NOTE — Progress Notes (Signed)
Pt did not attend nutrition group. 

## 2020-10-18 NOTE — [facility] Counselor (Signed)
[facility] LCSW Note  10/18/2020   11:35 AM  Type of Contact and Topic:  PSA Attempt  CSW made additional efforts to complete PSA with pt. Pt declined, refusing to get out of bed, stating the wish to complete at a later time.  CSW team will continue efforts to complete PSA.    Leisa Lenz, LCSW 10/18/2020  11:35 AM
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
## 2020-10-18 NOTE — Progress Notes (Signed)
Pt did not attend goals group. 

## 2020-10-18 NOTE — Progress Notes (Signed)
Nutrition Brief Note  Patient identified on the Malnutrition Screening Tool (MST) Report  Wt Readings from Last 15 Encounters:  10/16/20 93 kg  09/25/16 98.4 kg  09/18/16 98.4 kg  01/04/16 96.6 kg    Body mass index is 35.43 kg/m. Patient meets criteria for obesity based on current BMI.   Current diet order is regular. Labs and medications reviewed.   No nutrition interventions warranted at this time. If nutrition issues arise, please consult RD.   Tilda Franco, MS, RD, LDN Inpatient Clinical Dietitian Contact information available via Amion

## 2020-10-18 NOTE — Progress Notes (Signed)
Pt verbalized continued anxiety and what she describes as looming panic attack, she requested for and received PRN Xanax at bedtime which was effective on follow up. Pt denied AVH/HI/SI. She was relatively social with peers and attended evening wrap up group. She is resting in bed at this time, being monitored as ordered.

## 2020-10-19 LAB — URINALYSIS, COMPLETE (UACMP) WITH MICROSCOPIC
Bacteria, UA: NONE SEEN
Bilirubin Urine: NEGATIVE
Glucose, UA: NEGATIVE mg/dL
Hgb urine dipstick: NEGATIVE
Ketones, ur: NEGATIVE mg/dL
Leukocytes,Ua: NEGATIVE
Nitrite: NEGATIVE
Protein, ur: NEGATIVE mg/dL
Specific Gravity, Urine: 1.018 (ref 1.005–1.030)
pH: 6 (ref 5.0–8.0)

## 2020-10-19 LAB — GLUCOSE, CAPILLARY: Glucose-Capillary: 97 mg/dL (ref 70–99)

## 2020-10-19 LAB — HEMOGLOBIN A1C
Hgb A1c MFr Bld: 6.7 % — ABNORMAL HIGH (ref 4.8–5.6)
Mean Plasma Glucose: 146 mg/dL

## 2020-10-19 MED ORDER — ALPRAZOLAM 0.5 MG PO TABS
1.0000 mg | ORAL_TABLET | Freq: Every day | ORAL | Status: DC
  Administered 2020-10-20 – 2020-10-22 (×3): 1 mg via ORAL
  Filled 2020-10-19 (×3): qty 4

## 2020-10-19 MED ORDER — LITHIUM CARBONATE 300 MG PO CAPS
300.0000 mg | ORAL_CAPSULE | Freq: Two times a day (BID) | ORAL | Status: DC
  Administered 2020-10-19 – 2020-10-22 (×6): 300 mg via ORAL
  Filled 2020-10-19 (×12): qty 1

## 2020-10-19 NOTE — Progress Notes (Signed)
Recreation Therapy Notes  Date:  6.3.22 Time: 0930 Location: 300 Hall Dayroom  Group Topic: Stress Management  Goal Area(s) Addresses:  Patient will identify positive stress management techniques. Patient will identify benefits of using stress management post d/c.  Intervention: Stress Management   Activity: Meditation.  LRT played a meditation that focused on looking at each day as a new opportunity to start your day on a good note.    Education:  Stress Management, Discharge Planning.   Education Outcome: Acknowledges Education  Clinical Observations/Feedback: Pt did not attend group session.    Malaisha Silliman, LRT/CTRS         Quanda Pavlicek A 10/19/2020 12:29 PM 

## 2020-10-19 NOTE — Progress Notes (Signed)
Adult Psychoeducational Group Note  Date:  10/19/2020 Time:  11:24 AM  Group Topic/Focus:  Goals Group:   The focus of this group is to help patients establish daily goals to achieve during treatment and discuss how the patient can incorporate goal setting into their daily lives to aide in recovery.  Participation Level:  Active  Participation Quality:  Appropriate  Affect:  Appropriate  Cognitive:  Appropriate  Insight: Appropriate  Engagement in Group:  Engaged  Modes of Intervention:  Discussion  Additional Comments:  Pt states her goal for the day is to identify coping skills for anxiety.  Wynema Birch D 10/19/2020, 11:24 AM

## 2020-10-19 NOTE — Progress Notes (Signed)
Pt did not attend wrap-up group   

## 2020-10-19 NOTE — [facility] Group Notes (Signed)
[facility] LCSW Group Therapy Note   Type of Therapy and Topic:  Group Therapy:  Feelings About Hospitalization  Participation Level:  Active   Description of Group This process group involved patients discussing their feelings related to being hospitalized, as well as the benefits they see to being in the hospital.  These feelings and benefits were itemized.  The group then brainstormed specific ways in which they could seek those same benefits when they discharge and return home.  Therapeutic Goals 1. Patient will identify and describe positive and negative feelings related to hospitalization 2. Patient will verbalize benefits of hospitalization to themselves personally 3. Patients will brainstorm together ways they can obtain similar benefits in the outpatient setting, identify barriers to wellness and possible solutions  Summary of Patient Progress:  Pt shared during introductions that today she feels nervous, scared, and worried because of things going on in her personal life. Pt spoke throughout group but was often off topic and had to be redirected.   Therapeutic Modalities Cognitive Behavioral Therapy Motivational Interviewing

## 2020-10-19 NOTE — Progress Notes (Signed)
   10/18/20 2300  Psych Admission Type (Psych Patients Only)  Admission Status Voluntary  Psychosocial Assessment  Patient Complaints None  Eye Contact Fair  Facial Expression Anxious  Affect Anxious  Speech Logical/coherent  Interaction Assertive  Motor Activity Restless  Appearance/Hygiene Unremarkable  Thought Process  Coherency WDL  Content WDL  Delusions WDL  Perception WDL  Hallucination None reported or observed  Judgment Limited  Confusion None  Danger to Self  Current suicidal ideation? Denies  Self-Injurious Behavior No self-injurious ideation or behavior indicators observed or expressed   Agreement Not to Harm Self Yes  Description of Agreement verbal  Danger to Others  Danger to Others None reported or observed
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
## 2020-10-20 LAB — GLUCOSE, CAPILLARY: Glucose-Capillary: 89 mg/dL (ref 70–99)

## 2020-10-20 NOTE — Progress Notes (Signed)
   10/20/20 1200  Psych Admission Type (Psych Patients Only)  Admission Status Voluntary  Psychosocial Assessment  Patient Complaints None  Eye Contact Fair  Facial Expression Anxious  Affect Anxious  Speech Logical/coherent  Interaction Assertive  Motor Activity Restless  Appearance/Hygiene Unremarkable  Behavior Characteristics Cooperative  Mood Anxious;Depressed  Thought Process  Coherency WDL  Content WDL  Delusions WDL  Perception WDL  Hallucination None reported or observed  Judgment Limited  Confusion None  Danger to Self  Current suicidal ideation? Denies  Self-Injurious Behavior No self-injurious ideation or behavior indicators observed or expressed   Agreement Not to Harm Self Yes  Description of Agreement verbal  Danger to Others  Danger to Others None reported or observed

## 2020-10-20 NOTE — Progress Notes (Signed)
   10/20/20 2314  COVID-19 Daily Checkoff  Have you had a fever (temp > 37.80C/100F)  in the past 24 hours?  No  If you have had runny nose, nasal congestion, sneezing in the past 24 hours, has it worsened? No  COVID-19 EXPOSURE  Have you traveled outside the state in the past 14 days? No  Have you been in contact with someone with a confirmed diagnosis of COVID-19 or PUI in the past 14 days without wearing appropriate PPE? No  Have you been living in the same home as a person with confirmed diagnosis of COVID-19 or a PUI (household contact)? No  Have you been diagnosed with COVID-19? No
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
## 2020-10-20 NOTE — Progress Notes (Signed)
Patient attend wrap up group AA. 

## 2020-10-20 NOTE — Progress Notes (Signed)
Patient was cooperative with treatment, visible in the milieu with staff and peers. She remains sad and anxious but she denies AVH. SHe is currently in bed resting quietly at this time.

## 2020-10-20 NOTE — Progress Notes (Signed)
   10/20/20 2317  Psych Admission Type (Psych Patients Only)  Admission Status Voluntary  Psychosocial Assessment  Patient Complaints Anxiety  Eye Contact Fair  Facial Expression Anxious  Affect Appropriate to circumstance  Speech Logical/coherent  Interaction Assertive  Motor Activity Restless  Appearance/Hygiene Unremarkable  Behavior Characteristics Appropriate to situation  Mood Anxious;Pleasant  Thought Process  Coherency WDL  Content WDL  Delusions None reported or observed  Perception WDL  Hallucination None reported or observed  Judgment Poor  Confusion None  Danger to Self  Current suicidal ideation? Denies  Self-Injurious Behavior No self-injurious ideation or behavior indicators observed or expressed   Agreement Not to Harm Self Yes  Description of Agreement verbal  Danger to Others  Danger to Others None reported or observed
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
## 2020-10-21 LAB — GLUCOSE, CAPILLARY: Glucose-Capillary: 87 mg/dL (ref 70–99)

## 2020-10-21 MED ORDER — ASPIRIN-ACETAMINOPHEN-CAFFEINE 250-250-65 MG PO TABS
1.0000 | ORAL_TABLET | Freq: Three times a day (TID) | ORAL | Status: DC | PRN
  Administered 2020-10-21: 1 via ORAL
  Filled 2020-10-21: qty 1

## 2020-10-21 NOTE — Progress Notes (Signed)
   10/21/20 1300  Psych Admission Type (Psych Patients Only)  Admission Status Voluntary  Psychosocial Assessment  Patient Complaints Anxiety  Eye Contact Fair  Facial Expression Anxious  Affect Appropriate to circumstance  Speech Logical/coherent  Interaction Assertive  Motor Activity Restless  Appearance/Hygiene Unremarkable  Behavior Characteristics Appropriate to situation;Cooperative  Mood Anxious  Thought Process  Coherency WDL  Content WDL  Delusions None reported or observed  Perception WDL  Hallucination None reported or observed  Judgment Poor  Confusion None  Danger to Self  Current suicidal ideation? Denies  Self-Injurious Behavior No self-injurious ideation or behavior indicators observed or expressed   Agreement Not to Harm Self Yes  Description of Agreement verbal  Danger to Others  Danger to Others None reported or observed

## 2020-10-21 NOTE — [facility] Suicide Risk Assessment (Addendum)
[facility] INPATIENT:  Family/Significant Other Suicide Prevention Education  Suicide Prevention Education:  Contact Attempts: Melvern Banker, Mother, [phone], (name of family member/significant other) has been identified by the patient as the family member/significant other with whom the patient will be residing, and identified as the person(s) who will aid the patient in the event of a mental health crisis.  With written consent from the patient, two attempts were made to provide suicide prevention education, prior to and/or following the patient's discharge.  We were unsuccessful in providing suicide prevention education.  A suicide education pamphlet was given to the patient to share with family/significant other.  Date and time of first attempt:  10/21/2020  /  3:45pm  HIPAA-compliant VM left Date and time of second attempt:   CSW team to continue attempts  Lynnell Chad 10/21/2020, 3:48 PM   Date and time of second attempt:   10/21/2020   /   4:30pm - HIPAA-compliant VM left with # of weekday CSW team  Ambrose Mantle, LCSW 10/21/2020, 4:30 PM

## 2020-10-21 NOTE — Progress Notes (Signed)
Patient's positive event for the day is that she enjoyed talking to the MHT and playing cards. Her goal for tomorrow is to spend more time in the dayroom than today.

## 2020-10-21 NOTE — [facility] Group Notes (Signed)
Patient did not attend the goals group. 

## 2020-10-21 NOTE — Progress Notes (Signed)
   10/21/20 2236  Psych Admission Type (Psych Patients Only)  Admission Status Voluntary  Psychosocial Assessment  Patient Complaints Anxiety  Eye Contact Fair  Facial Expression Anxious  Affect Appropriate to circumstance  Speech Logical/coherent  Interaction Assertive  Motor Activity Restless  Appearance/Hygiene Unremarkable  Behavior Characteristics Cooperative  Mood Anxious  Thought Process  Coherency WDL  Content WDL  Delusions None reported or observed  Perception WDL  Hallucination None reported or observed  Judgment Poor  Confusion None  Danger to Self  Current suicidal ideation? Denies  Self-Injurious Behavior No self-injurious ideation or behavior indicators observed or expressed   Agreement Not to Harm Self Yes  Description of Agreement verbal  Danger to Others  Danger to Others None reported or observed  D: Patient playing cards with peers in the dayroom on approach. Pt reports having a headache earlier today but feels relieve with medication given. Pt reports decrease anxiety and depressive symptoms. A: Medications administered as prescribed. Support and encouragement provided as needed.  R: Patient remains safe on the unit. Will continue to monitor for safety and stability.

## 2020-10-21 NOTE — [facility] Group Notes (Signed)
[facility] LCSW Group Therapy Note  10/21/2020    Type of Therapy and Topic:  Group Therapy:  A Hero Worthy of Support  Participation Level:  Did Not Attend   Description of Group:  Patients in this group were introduced to the concept that additional supports including self-support are an essential part of recovery.  Matching needs with supports to help fulfill those needs was explained.  Establishing boundaries that can gradually be increased or decreased was described, with patients giving their own examples of establishing appropriate boundaries in their lives.  A song entitled "My Own Hero" was played and a group discussion ensued in which patients stated it inspired them to help themselves in order to succeed, because other people cannot achieve their goals such as sobriety or stability for them.  A song was played called "I Am Enough" which led to a discussion about being willing to believe we are worth the effort of being a self-support.   Therapeutic Goals: 1)  demonstrate the importance of being a key part of one's own support system 2)  discuss various available supports 3)  encourage patient to use music as part of their self-support and focus on goals 4)  elicit ideas from patients about supports that need to be added   Summary of Patient Progress:  The patient was invited to group by MHT and by overhead announcement, but did not attend.  Therapeutic Modalities:   Motivational Interviewing Activity  Lynnell Chad
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
## 2020-10-22 LAB — GLUCOSE, CAPILLARY: Glucose-Capillary: 85 mg/dL (ref 70–99)

## 2020-10-22 LAB — LITHIUM LEVEL: Lithium Lvl: 0.47 mmol/L — ABNORMAL LOW (ref 0.60–1.20)

## 2020-10-22 MED ORDER — TRAZODONE HCL 50 MG PO TABS: 50.0000 mg | ORAL_TABLET | Freq: Every evening | ORAL | 0 refills | Status: AC | PRN

## 2020-10-22 MED ORDER — METFORMIN HCL ER 500 MG PO TB24: 500.0000 mg | ORAL_TABLET | Freq: Every day | ORAL | 0 refills | Status: AC

## 2020-10-22 MED ORDER — BUSPIRONE HCL 10 MG PO TABS: 10.0000 mg | ORAL_TABLET | Freq: Three times a day (TID) | ORAL | 0 refills | Status: AC

## 2020-10-22 MED ORDER — NICOTINE 21 MG/24HR TD PT24: 21.0000 mg | MEDICATED_PATCH | Freq: Every day | TRANSDERMAL | 0 refills | Status: AC

## 2020-10-22 MED ORDER — LITHIUM CARBONATE 300 MG PO CAPS: 300.0000 mg | ORAL_CAPSULE | Freq: Two times a day (BID) | ORAL | 0 refills | Status: AC

## 2020-10-22 MED ORDER — ATENOLOL 100 MG PO TABS: 100.0000 mg | ORAL_TABLET | Freq: Every day | ORAL | 0 refills | Status: AC

## 2020-10-22 MED ORDER — CHLORTHALIDONE 25 MG PO TABS: 25.0000 mg | ORAL_TABLET | Freq: Every day | ORAL | 0 refills | Status: AC

## 2020-10-22 NOTE — [facility] Group Notes (Signed)
Spiritual care group on grief and loss facilitated by chaplain Janetta Hora Lomax, BCC, LCAS-A  Group Goal: Support / Education around grief and loss   Members engage in facilitated group support and psycho-social education.   Group Description:   Following introductions and group rules, group members engaged in facilitated group dialogue and support around topic of loss, with particular support around experiences of loss in their lives. Group Identified types of loss (relationships / self / things) and identified their own healthy and unhealthy coping patternss as well as childhood messages received about grief. Reflected on thoughts / feelings around loss, normalized grief responses, and recognized variety in grief experience.   Group drew on Adlerian / Rogerian, narrative, Motivational Interviewing, Mindfulness   Level of Engagement/Patient Progress: Patient was alert and oriented x 4 and was an active participant in group. She shared that she felt dread in considering thoughts of grief and loss and regularly apologized when she felt like she was going to cry. Tina Mcdaniel reports tendencies to make herself hurt through starvation, substance abuse or cutting in order to cope with and find distraction from emotional pain. She reports when she learned that she was going to be discharged today she wanted to find her phone, reach out to her contacts, and use as much as she could. Pt named feeling overwhelmed by a lot and coming from a family that deals with difficulty by hiding it and saying you need to suck it up and move on. She reports group members helped her to make a list of everything she needs to accomplish and she found that helpful.    [name]. Carley Hammed, M.Div. [facility] Chaplain Pager [phone] Office [phone]

## 2020-10-22 NOTE — Tx Team (Signed)
Interdisciplinary Treatment and Diagnostic Plan Update  10/22/2020 Time of Session: 9:15am  Anastasiya Gowin MRN: [id number]  Principal Diagnosis: Major depressive disorder, recurrent episode, severe (HCC)  Secondary Diagnoses: Principal Problem:   Major depressive disorder, recurrent episode, severe (HCC) Active Problems:   Unspecified mood (affective) disorder (HCC)   Anxiety disorder, unspecified   PTSD (post-traumatic stress disorder)   Current Medications:  Current Facility-Administered Medications  Medication Dose Route Frequency Provider Last Rate Last Admin  . acetaminophen (TYLENOL) tablet 650 mg  650 mg Oral Q6H PRN White, Patrice L, NP   650 mg at 10/21/20 0902  . ALPRAZolam Prudy Feeler) tablet 1 mg  1 mg Oral QHS PRN Antonieta Pert, MD   1 mg at 10/21/20 2102  . ALPRAZolam Prudy Feeler) tablet 1 mg  1 mg Oral Daily Claudie Revering, MD   1 mg at 10/22/20 0748  . alum & mag hydroxide-simeth (MAALOX/MYLANTA) 200-200-20 MG/5ML suspension 30 mL  30 mL Oral Q4H PRN White, Patrice L, NP      . aspirin-acetaminophen-caffeine (EXCEDRIN MIGRAINE) per tablet 1 tablet  1 tablet Oral Q8H PRN Armandina Stammer I, NP   1 tablet at 10/21/20 1409  . atenolol (TENORMIN) tablet 100 mg  100 mg Oral Daily Antonieta Pert, MD   100 mg at 10/22/20 0748  . baclofen (LIORESAL) tablet 10 mg  10 mg Oral TID PRN Antonieta Pert, MD      . busPIRone (BUSPAR) tablet 10 mg  10 mg Oral TID Antonieta Pert, MD   10 mg at 10/22/20 0748  . chlorthalidone (HYGROTON) tablet 25 mg  25 mg Oral Daily Antonieta Pert, MD   25 mg at 10/22/20 0748  . hydrOXYzine (ATARAX/VISTARIL) tablet 25 mg  25 mg Oral TID PRN White, Patrice L, NP   25 mg at 10/22/20 0023  . lithium carbonate capsule 300 mg  300 mg Oral BID WC Claudie Revering, MD   300 mg at 10/22/20 0748  . magnesium hydroxide (MILK OF MAGNESIA) suspension 30 mL  30 mL Oral Daily PRN White, Patrice L, NP      . metFORMIN (GLUCOPHAGE-XR) 24 hr tablet 500 mg  500 mg  Oral QHS Antonieta Pert, MD   500 mg at 10/21/20 2103  . multivitamin with minerals tablet 1 tablet  1 tablet Oral Daily Claudie Revering, MD   1 tablet at 10/22/20 0748  . nicotine (NICODERM CQ - dosed in mg/24 hours) patch 21 mg  21 mg Transdermal Daily White, Patrice L, NP   21 mg at 10/22/20 0748  . ondansetron (ZOFRAN-ODT) disintegrating tablet 4 mg  4 mg Oral Q8H PRN Antonieta Pert, MD      . thiamine tablet 100 mg  100 mg Oral Daily Claudie Revering, MD   100 mg at 10/22/20 0748  . traZODone (DESYREL) tablet 50 mg  50 mg Oral QHS PRN White, Patrice L, NP   50 mg at 10/21/20 2103   PTA Medications: Medications Prior to Admission  Medication Sig Dispense Refill Last Dose  . lithium carbonate 300 MG capsule Take 1 capsule by mouth in the morning, at noon, and at bedtime.     . ALPRAZolam (XANAX) 1 MG tablet 1 mg 2 (two) times daily.     . busPIRone (BUSPAR) 10 MG tablet Take 10 mg by mouth 3 (three) times daily.       Patient Stressors: Medication change or noncompliance Traumatic event  Patient Strengths: Ability for  insight Capable of independent living Communication skills Motivation for treatment/growth Supportive family/friends  Treatment Modalities: Medication Management, Group therapy, Case management,  1 to 1 session with clinician, Psychoeducation, Recreational therapy.   Physician Treatment Plan for Primary Diagnosis: Major depressive disorder, recurrent episode, severe (HCC) Long Term Goal(s): Improvement in symptoms so as ready for discharge Improvement in symptoms so as ready for discharge   Short Term Goals: Ability to identify changes in lifestyle to reduce recurrence of condition will improve Ability to verbalize feelings will improve Ability to disclose and discuss suicidal ideas Ability to demonstrate self-control will improve Ability to identify and develop effective coping behaviors will improve Compliance with prescribed medications will  improve Ability to identify triggers associated with substance abuse/mental health issues will improve Ability to identify changes in lifestyle to reduce recurrence of condition will improve Ability to verbalize feelings will improve Ability to disclose and discuss suicidal ideas Ability to demonstrate self-control will improve Ability to identify and develop effective coping behaviors will improve Compliance with prescribed medications will improve Ability to identify triggers associated with substance abuse/mental health issues will improve  Medication Management: Evaluate patient's response, side effects, and tolerance of medication regimen.  Therapeutic Interventions: 1 to 1 sessions, Unit Group sessions and Medication administration.  Evaluation of Outcomes: Adequate for Discharge  Physician Treatment Plan for Secondary Diagnosis: Principal Problem:   Major depressive disorder, recurrent episode, severe (HCC) Active Problems:   Unspecified mood (affective) disorder (HCC)   Anxiety disorder, unspecified   PTSD (post-traumatic stress disorder)  Long Term Goal(s): Improvement in symptoms so as ready for discharge Improvement in symptoms so as ready for discharge   Short Term Goals: Ability to identify changes in lifestyle to reduce recurrence of condition will improve Ability to verbalize feelings will improve Ability to disclose and discuss suicidal ideas Ability to demonstrate self-control will improve Ability to identify and develop effective coping behaviors will improve Compliance with prescribed medications will improve Ability to identify triggers associated with substance abuse/mental health issues will improve Ability to identify changes in lifestyle to reduce recurrence of condition will improve Ability to verbalize feelings will improve Ability to disclose and discuss suicidal ideas Ability to demonstrate self-control will improve Ability to identify and develop  effective coping behaviors will improve Compliance with prescribed medications will improve Ability to identify triggers associated with substance abuse/mental health issues will improve     Medication Management: Evaluate patient's response, side effects, and tolerance of medication regimen.  Therapeutic Interventions: 1 to 1 sessions, Unit Group sessions and Medication administration.  Evaluation of Outcomes: Adequate for Discharge   RN Treatment Plan for Primary Diagnosis: Major depressive disorder, recurrent episode, severe (HCC) Long Term Goal(s): Knowledge of disease and therapeutic regimen to maintain health will improve  Short Term Goals: Ability to remain free from injury will improve, Ability to verbalize frustration and anger appropriately will improve, Ability to demonstrate self-control, Ability to participate in decision making will improve, Ability to verbalize feelings will improve, Ability to disclose and discuss suicidal ideas and Ability to identify and develop effective coping behaviors will improve  Medication Management: RN will administer medications as ordered by provider, will assess and evaluate patient's response and provide education to patient for prescribed medication. RN will report any adverse and/or side effects to prescribing provider.  Therapeutic Interventions: 1 on 1 counseling sessions, Psychoeducation, Medication administration, Evaluate responses to treatment, Monitor vital signs and CBGs as ordered, Perform/monitor CIWA, COWS, AIMS and Fall Risk screenings as ordered, Perform wound  care treatments as ordered.  Evaluation of Outcomes: Adequate for Discharge   LCSW Treatment Plan for Primary Diagnosis: Major depressive disorder, recurrent episode, severe (HCC) Long Term Goal(s): Safe transition to appropriate next level of care at discharge, Engage patient in therapeutic group addressing interpersonal concerns.  Short Term Goals: Engage patient in  aftercare planning with referrals and resources, Increase social support, Increase ability to appropriately verbalize feelings, Increase emotional regulation, Facilitate acceptance of mental health diagnosis and concerns, Identify triggers associated with mental health/substance abuse issues and Increase skills for wellness and recovery  Therapeutic Interventions: Assess for all discharge needs, 1 to 1 time with Social worker, Explore available resources and support systems, Assess for adequacy in community support network, Educate family and significant other(s) on suicide prevention, Complete Psychosocial Assessment, Interpersonal group therapy.  Evaluation of Outcomes: Adequate for Discharge   Progress in Treatment: Attending groups: No. Participating in groups: No. Taking medication as prescribed: Yes. Toleration medication: Yes. Family/Significant other contact made: No, will contact:  attempted to reach mother Patient understands diagnosis: No. Discussing patient identified problems/goals with staff: Yes. Medical problems stabilized or resolved: Yes. Denies suicidal/homicidal ideation: Yes. Issues/concerns per patient self-inventory: No.   New problem(s) identified: No, Describe:  None   New Short Term/Long Term Goal(s): medication stabilization, elimination of SI thoughts, development of comprehensive mental wellness plan.   Patient Goals: "I just want my Xanax"   Discharge Plan or Barriers: Patient is to return to stay with mother. Patient is to follow up with [facility] and [facility] for continued services.  Reason for Continuation of Hospitalization: Medication stabilization  Estimated Length of Stay: Adequate for discharge  Attendees: Patient:  10/17/2020  Physician:  10/17/2020   Nursing:  10/17/2020   RN Care Manager: 10/17/2020   Social Worker: Ruthann Cancer, LCSW  10/17/2020   Recreational Therapist:  10/17/2020   Other:  10/17/2020   Other:  10/17/2020   Other: 10/17/2020      Scribe for Treatment Team: Otelia Santee, LCSW 10/22/2020 11:05 AM

## 2020-10-22 NOTE — Progress Notes (Signed)
D:  Patient's self inventory sheet, patient has poor sleep, sleep medication not helpful.  Fair appetite, low energy level, poor concentration.  Rated depression, hopeless and anxiety 7.  Denied withdrawals.  SI, sometimes, contracts for safety.  Physical problems, pain #5 in past 24 hours.  Goal is keep stress level down.  Plans to stay around friends.  No discharge plans. A:  Medications administered per MD orders.  Emotional support and encouragement given patient. R:  Denied SI and HI, contracts for safety.  Denied A/V hallucinations.  Safety maintained with 15 minute checks.
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
## 2020-10-22 NOTE — Progress Notes (Signed)
Recreation Therapy Notes  Date:  6.6.22 Time: 0930 Location: [address] Dayroom  Group Topic: Stress Management  Goal Area(s) Addresses:  Patient will identify positive stress management techniques. Patient will identify benefits of using stress management post d/c.  Intervention: Stress Management  Activity: Meditation.  LRT played a meditation that focused on setting boundaries and that it's ok to say no for your own self care even if it's not what others want.    Education:  Stress Management, Discharge Planning.   Education Outcome: Acknowledges Education  Clinical Observations/Feedback: Pt did not attend group session.     Keyerra Lamere, LRT/CTRS         Arpi Diebold A 10/22/2020 12:30 PM 

## 2020-10-22 NOTE — Progress Notes (Signed)
  [facility] Adult Case Management Discharge Plan :  Will you be returning to the same living situation after discharge:  Yes,  personal apartment or mother At discharge, do you have transportation home?: Yes,  mother or [employer] Do you have the ability to pay for your medications: Yes,  medicare  Release of information consent forms completed and in the chart;  Patient's signature needed at discharge.  Patient to Follow up at:  Follow-up Information     Center, [facility] Call on 10/22/2020.   Why: Efforts to schedule a follow up appointment with this provider were made however unable to be secured. Contact this provider directly in order to schedule an appointment.  Contact information: [address] [place] [address] [phone]         [facility], [address] [facility]. Go on 10/23/2020.   Why: You have a hospital follow up intake appointment on 10/23/20 at 08:30am. This appointment will be held in person. Following this appointment, you will be scheduled for medication management services. Contact information: [address] [place] [address] [phone]                 Next level of care provider has access to [facility] Link:no  Safety Planning and Suicide Prevention discussed: Yes,  w/ pt     Has patient been referred to the Quitline?: Patient refused referral  Patient has been referred for addiction treatment: N/A  Felizardo Hoffmann, LCSWA 10/22/2020, 9:36 AM

## 2020-10-22 NOTE — Progress Notes (Signed)
Psychoeducational Group Note  Date:  10/22/2020 Time:  1117  Group Topic/Focus:  Goals Group:   The focus of this group is to help patients establish daily goals to achieve during treatment and discuss how the patient can incorporate goal setting into their daily lives to aide in recovery.  Participation Level: Did Not Attend  Participation Quality:  Not Applicable  Affect:  Not Applicable  Cognitive:  Not Applicable  Insight:  Not Applicable  Engagement in Group: Not Applicable  Additional Comments:  Pt was present in the dayroom at the start of the group session but got up and walked out at the start of the group session.  Janesha Brissette E 10/22/2020, 11:31 AM

## 2020-10-22 NOTE — [facility] Suicide Risk Assessment (Signed)
[facility] Discharge Suicide Risk Assessment   Principal Problem: Major depressive disorder, recurrent episode, severe (HCC) Discharge Diagnoses: Principal Problem:   Major depressive disorder, recurrent episode, severe (HCC) Active Problems:   Unspecified mood (affective) disorder (HCC)   Anxiety disorder, unspecified   PTSD (post-traumatic stress disorder)   Total Time spent with patient: 15 minutes  Musculoskeletal: Strength & Muscle Tone: within normal limits Gait & Station: normal Patient leans: N/A  Psychiatric Specialty Exam  Presentation  General Appearance: Casual  Eye Contact:Fair  Speech:Clear and Coherent; Normal Rate  Speech Volume:Normal  Handedness:No data recorded  Mood and Affect  Mood:Anxious  Duration of Depression Symptoms: No data recorded Affect:Full Range   Thought Process  Thought Processes:Coherent; Goal Directed; Linear  Descriptions of Associations:Intact  Orientation:Full (Time, Place and Person)  Thought Content:Logical  History of Schizophrenia/Schizoaffective disorder:No data recorded Duration of Psychotic Symptoms:No data recorded Hallucinations:Hallucinations: None  Ideas of Reference:None  Suicidal Thoughts:Suicidal Thoughts: No  Homicidal Thoughts:Homicidal Thoughts: No   Sensorium  Memory:Immediate Good; Recent Good; Remote Good  Judgment:Fair  Insight:Fair   Executive Functions  Concentration:Good  Attention Span:Good  Recall:Good  Fund of Knowledge:Good  Language:Good   Psychomotor Activity  Psychomotor Activity:Psychomotor Activity: Normal   Assets  Assets:Communication Skills; Desire for Improvement; Housing; Physical Health; Resilience; Social Support   Sleep  Sleep:Sleep: Fair   Physical Exam: Physical Exam Vitals and nursing note reviewed.  Constitutional:      General: She is not in acute distress. HENT:     Head: Normocephalic and atraumatic.  Pulmonary:     Effort: Pulmonary  effort is normal.  Neurological:     General: No focal deficit present.     Mental Status: She is alert. Mental status is at baseline.    Review of Systems  Respiratory: Negative.   Cardiovascular: Negative.   Gastrointestinal: Negative.    Blood pressure 106/77, pulse 80, temperature (!) 97.4 F (36.3 C), temperature source Oral, resp. rate 16, height 5' 3.78" (1.62 m), weight 93 kg, SpO2 100 %. Body mass index is 35.43 kg/m.  Mental Status Per Nursing Assessment::   On Admission:  Suicidal ideation indicated by patient  Demographic Factors:  Caucasian, Living alone and Unemployed  Loss Factors: Loss of significant relationship  Historical Factors: Prior suicide attempts, Family history of mental illness or substance abuse and Impulsivity  Risk Reduction Factors:   Positive social support, Positive therapeutic relationship and Positive coping skills or problem solving skills  Continued Clinical Symptoms:  Anxiety - improved Depression - improved Previous Psychiatric Diagnoses and Treatments  Cognitive Features That Contribute To Risk:  Polarized thinking    Suicide Risk:  Minimal: No identifiable suicidal ideation.  Patients presenting with no risk factors but with morbid ruminations; may be classified as minimal risk based on the severity of the depressive symptoms   Follow-up Information    Center, [facility]. Call on 10/22/2020.   Why: Efforts to schedule a follow up appointment with this provider were made however unable to be secured. Contact this provider directly in order to schedule an appointment.  Contact information: [address] [place] [address] [phone]        [facility], [address] [facility]. Go on 10/23/2020.   Why: You have a hospital follow up intake appointment on 10/23/20 at 08:30am. This appointment will be held in person. Following this appointment, you will be scheduled for medication management services. Contact  information: [address] [place] [address] [phone]  Plan Of Care/Follow-up recommendations:  Activity:  As tolerated  Other:   -Take medications as prescribed.   -Do not drink alcohol.  Do not use marijuana or other drugs.   -Keep outpatient mental health follow-up appointments with therapist and psychiatrist.   -See your primary care provider for treatment of medical conditions.  Claudie Revering, MD 10/22/2020, 9:21 AM

## 2020-10-22 NOTE — Progress Notes (Signed)
Discharge Note:  Patient discharged home with mother.  Suicide prevention information given and discussed with patient who stated she understood and had no questions.  Patient denied SI and HI.  Denied A/V hallucinations.  Patient stated she received all her belongings, clothing, toiletries, misc items.  Patient stated she appreciated all assistance received from [facility] staff.  All discharge information given.

## 2022-03-19 ENCOUNTER — Encounter (HOSPITAL_BASED_OUTPATIENT_CLINIC_OR_DEPARTMENT_OTHER): Payer: Self-pay

## 2022-03-19 DIAGNOSIS — R0683 Snoring: Secondary | ICD-10-CM

## 2022-03-19 DIAGNOSIS — G4700 Insomnia, unspecified: Secondary | ICD-10-CM

## 2022-03-19 DIAGNOSIS — G4710 Hypersomnia, unspecified: Secondary | ICD-10-CM

## 2022-04-02 ENCOUNTER — Other Ambulatory Visit: Payer: Self-pay | Admitting: *Deleted

## 2022-04-02 DIAGNOSIS — R600 Localized edema: Secondary | ICD-10-CM

## 2022-04-16 ENCOUNTER — Ambulatory Visit (HOSPITAL_COMMUNITY): Payer: Medicare Other | Attending: Specialist

## 2022-04-30 ENCOUNTER — Encounter: Payer: Self-pay | Admitting: Vascular Surgery

## 2022-11-04 ENCOUNTER — Other Ambulatory Visit (HOSPITAL_BASED_OUTPATIENT_CLINIC_OR_DEPARTMENT_OTHER): Payer: Self-pay | Admitting: Surgery

## 2022-11-04 DIAGNOSIS — M7918 Myalgia, other site: Secondary | ICD-10-CM

## 2022-11-14 ENCOUNTER — Telehealth (HOSPITAL_BASED_OUTPATIENT_CLINIC_OR_DEPARTMENT_OTHER): Payer: Self-pay

## 2022-11-21 ENCOUNTER — Telehealth (HOSPITAL_BASED_OUTPATIENT_CLINIC_OR_DEPARTMENT_OTHER): Payer: Self-pay

## 2022-11-30 ENCOUNTER — Ambulatory Visit (HOSPITAL_BASED_OUTPATIENT_CLINIC_OR_DEPARTMENT_OTHER): Admission: RE | Admit: 2022-11-30 | Payer: 59 | Source: Ambulatory Visit

## 2022-12-07 ENCOUNTER — Ambulatory Visit (HOSPITAL_BASED_OUTPATIENT_CLINIC_OR_DEPARTMENT_OTHER)
Admission: RE | Admit: 2022-12-07 | Discharge: 2022-12-07 | Disposition: A | Discharge status: Home / Self Care | Payer: 59 | Source: Ambulatory Visit | Attending: Surgery | Admitting: Surgery

## 2022-12-07 DIAGNOSIS — M7918 Myalgia, other site: Secondary | ICD-10-CM | POA: Diagnosis present

## 2023-06-30 ENCOUNTER — Other Ambulatory Visit: Payer: Self-pay

## 2023-06-30 ENCOUNTER — Emergency Department (HOSPITAL_BASED_OUTPATIENT_CLINIC_OR_DEPARTMENT_OTHER)
Admission: EM | Admit: 2023-06-30 | Discharge: 2023-06-30 | Discharge status: Against Medical Advice | Payer: 59 | Attending: Emergency Medicine | Admitting: Emergency Medicine

## 2023-06-30 ENCOUNTER — Encounter (HOSPITAL_BASED_OUTPATIENT_CLINIC_OR_DEPARTMENT_OTHER): Payer: Self-pay

## 2023-06-30 DIAGNOSIS — R109 Unspecified abdominal pain: Secondary | ICD-10-CM | POA: Diagnosis present

## 2023-06-30 DIAGNOSIS — R1110 Vomiting, unspecified: Secondary | ICD-10-CM | POA: Diagnosis not present

## 2023-06-30 DIAGNOSIS — Z5321 Procedure and treatment not carried out due to patient leaving prior to being seen by health care provider: Secondary | ICD-10-CM | POA: Diagnosis not present

## 2023-06-30 DIAGNOSIS — R140 Abdominal distension (gaseous): Secondary | ICD-10-CM | POA: Insufficient documentation

## 2023-06-30 DIAGNOSIS — K5900 Constipation, unspecified: Secondary | ICD-10-CM | POA: Diagnosis not present

## 2023-06-30 DIAGNOSIS — R197 Diarrhea, unspecified: Secondary | ICD-10-CM | POA: Insufficient documentation

## 2023-06-30 LAB — CBC
HCT: 35.1 % — ABNORMAL LOW (ref 36.0–46.0)
Hemoglobin: 12 g/dL (ref 12.0–15.0)
MCH: 29.1 pg (ref 26.0–34.0)
MCHC: 34.2 g/dL (ref 30.0–36.0)
MCV: 85.2 fL (ref 80.0–100.0)
Platelets: 368 10*3/uL (ref 150–400)
RBC: 4.12 MIL/uL (ref 3.87–5.11)
RDW: 12.7 % (ref 11.5–15.5)
WBC: 9.9 10*3/uL (ref 4.0–10.5)
nRBC: 0 % (ref 0.0–0.2)

## 2023-06-30 LAB — COMPREHENSIVE METABOLIC PANEL
ALT: 12 U/L (ref 0–44)
AST: 16 U/L (ref 15–41)
Albumin: 4.7 g/dL (ref 3.5–5.0)
Alkaline Phosphatase: 50 U/L (ref 38–126)
Anion gap: 13 (ref 5–15)
BUN: 8 mg/dL (ref 6–20)
CO2: 24 mmol/L (ref 22–32)
Calcium: 9.5 mg/dL (ref 8.9–10.3)
Chloride: 101 mmol/L (ref 98–111)
Creatinine, Ser: 0.95 mg/dL (ref 0.44–1.00)
GFR, Estimated: >60 mL/min (ref >60)
Glucose, Bld: 104 mg/dL — ABNORMAL HIGH (ref 70–99)
Potassium: 3.2 mmol/L — ABNORMAL LOW (ref 3.5–5.1)
Sodium: 138 mmol/L (ref 135–145)
Total Bilirubin: 0.6 mg/dL (ref 0.0–1.2)
Total Protein: 8.1 g/dL (ref 6.5–8.1)

## 2023-06-30 LAB — LIPASE, BLOOD: Lipase: 29 U/L (ref 11–51)

## 2023-06-30 NOTE — ED Notes (Signed)
Called pt X3 for Room, no response

## 2023-06-30 NOTE — ED Triage Notes (Signed)
Pt reports abdominal pain, constipation and emesis for the last couple days. Pt reports taking laxatives and suppositories without success over the last four days. Pt reports some watery diarrhea and now has abdominal pain and bloating.

## 2023-11-24 ENCOUNTER — Emergency Department (HOSPITAL_BASED_OUTPATIENT_CLINIC_OR_DEPARTMENT_OTHER)

## 2023-11-24 ENCOUNTER — Telehealth (HOSPITAL_BASED_OUTPATIENT_CLINIC_OR_DEPARTMENT_OTHER): Payer: Self-pay | Admitting: Emergency Medicine

## 2023-11-24 ENCOUNTER — Encounter (HOSPITAL_BASED_OUTPATIENT_CLINIC_OR_DEPARTMENT_OTHER): Payer: Self-pay | Admitting: Emergency Medicine

## 2023-11-24 ENCOUNTER — Emergency Department (HOSPITAL_BASED_OUTPATIENT_CLINIC_OR_DEPARTMENT_OTHER)
Admission: EM | Admit: 2023-11-24 | Discharge: 2023-11-24 | Disposition: A | Discharge status: Home / Self Care | Attending: Emergency Medicine | Admitting: Emergency Medicine

## 2023-11-24 ENCOUNTER — Other Ambulatory Visit: Payer: Self-pay

## 2023-11-24 DIAGNOSIS — F17200 Nicotine dependence, unspecified, uncomplicated: Secondary | ICD-10-CM | POA: Diagnosis not present

## 2023-11-24 DIAGNOSIS — M79662 Pain in left lower leg: Secondary | ICD-10-CM | POA: Diagnosis not present

## 2023-11-24 DIAGNOSIS — M7989 Other specified soft tissue disorders: Secondary | ICD-10-CM | POA: Diagnosis present

## 2023-11-24 NOTE — Discharge Instructions (Signed)
 Elevate leg, wear compression hose to help with pain and swelling. Your ultrasound study will be read by the radiologist. If this study is positive for DVT, we will contact you with next action steps. Follow up with your care team.

## 2023-11-24 NOTE — ED Notes (Signed)
 Pt alert and oriented X 4 at the time of discharge. RR even and unlabored. No acute distress noted. Pt verbalized understanding of discharge instructions as discussed. Pt ambulatory to lobby at time of discharge.

## 2023-11-24 NOTE — Telephone Encounter (Cosign Needed)
 Called to discuss DVT study results from today. Lower left leg equivocal, not well visualized. Recommend repeat study.

## 2023-11-24 NOTE — ED Triage Notes (Signed)
 Left leg pain since Sunday.  Pt sent here to rule out dvt

## 2023-11-24 NOTE — ED Provider Notes (Signed)
 [facility] Provider Note   CSN: [id number] Arrival date & time: 11/24/23  9040     Patient presents with: Leg Pain   Tina Mcdaniel is a 38 y.o. female.   38 year old female with complaint of pain and swelling in her left lower leg. First noted on Sunday to anterior left lower leg without injury. Went to PCP who sent to ER to evaluate for DVT. 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 Has tried using her mother's compressive device to help with her chronic lower extremity edema without significant improvement.  PMH anxiety, depression, seizures, PTSD. Patient is a daily smoker.        Prior to Admission medications   Medication Sig Start Date End Date Taking? Authorizing Provider  atenolol  (TENORMIN ) 100 MG tablet Take 1 tablet (100 mg total) by mouth daily. 10/23/20   Janifer Mitzie Retort, NP  busPIRone  (BUSPAR ) 10 MG tablet Take 1 tablet (10 mg total) by mouth 3 (three) times daily. 10/22/20   Parks, Laurie Britton, NP  chlorthalidone  (HYGROTON ) 25 MG tablet Take 1 tablet (25 mg total) by mouth daily. 10/23/20   Janifer Mitzie Retort, NP  lithium  carbonate 300 MG capsule Take 1 capsule (300 mg total) by mouth 2 (two) times daily with a meal. 10/22/20   Janifer Mitzie Retort, NP  metFORMIN  (GLUCOPHAGE -XR) 500 MG 24 hr tablet Take 1 tablet (500 mg total) by mouth at bedtime. 10/22/20   Parks, Laurie Britton, NP  nicotine  (NICODERM CQ  - DOSED IN MG/24 HOURS) 21 mg/24hr patch Place 1 patch (21 mg total) onto the skin daily. 10/23/20   Parks, Laurie Britton, NP  traZODone  (DESYREL ) 50 MG tablet Take 1 tablet (50 mg total) by mouth at bedtime as needed for sleep. 10/22/20   Janifer Mitzie Retort, NP    Allergies: Morphine     Review of Systems Negative except as per HPI Updated Vital Signs BP 120/74 (BP Location: Right Arm)   Pulse 88   Temp 97.8 F (36.6 C) (Oral)   Resp 14   Ht 5' 4  (1.626 m)   Wt 79.8 kg   LMP 11/17/2023   SpO2 99%   BMI 30.21 kg/m   Physical Exam Vitals and nursing note reviewed.  Constitutional:      General: She is not in acute distress.    Appearance: She is well-developed. She is not diaphoretic.  HENT:     Head: Normocephalic and atraumatic.  Pulmonary:     Effort: Pulmonary effort is normal.  Musculoskeletal:        General: Swelling and tenderness present. No deformity.       Legs:  Skin:    General: Skin is warm and dry.     Findings: No bruising, erythema or rash.  Neurological:     Mental Status: She is alert and oriented to person, place, and time.  Psychiatric:        Behavior: Behavior normal.     (all labs ordered are listed, but only abnormal results are displayed) Labs Reviewed - No data to display  EKG: None  Radiology: US  Venous Img Lower Unilateral Left Result Date: 11/24/2023 CLINICAL DATA:  Left lower extremity pain for 2 days. EXAM: Left LOWER EXTREMITY VENOUS DOPPLER ULTRASOUND TECHNIQUE: Gray-scale sonography with graded compression, as well as color Doppler and duplex ultrasound were performed to evaluate the lower extremity deep venous systems from the level  of the common femoral vein and including the common femoral, femoral, profunda femoral, popliteal and calf veins including the posterior tibial, peroneal and gastrocnemius veins when visible. The superficial great saphenous vein was also interrogated. Spectral Doppler was utilized to evaluate flow at rest and with distal augmentation maneuvers in the common femoral, femoral and popliteal veins. COMPARISON:  None Available. FINDINGS: Contralateral Common Femoral Vein: Respiratory phasicity is normal and symmetric with the symptomatic side. No evidence of thrombus. Normal compressibility. Common Femoral Vein: No evidence of thrombus. Normal compressibility, respiratory phasicity and response to augmentation. Saphenofemoral Junction: No evidence of thrombus.  Normal compressibility and flow on color Doppler imaging. Profunda Femoral Vein: No evidence of thrombus. Normal compressibility and flow on color Doppler imaging. Femoral Vein: No evidence of thrombus. Normal compressibility, respiratory phasicity and response to augmentation. Popliteal Vein: No evidence of thrombus. Normal compressibility, respiratory phasicity and response to augmentation. Calf Veins: Posterior tibial and peroneal veins are not well visualized or not visualized at all. Superficial Great Saphenous Vein: No evidence of thrombus. Normal compressibility. Venous Reflux:  None. Other Findings:  None. IMPRESSION: No definite evidence of deep venous thrombosis seen in left lower extremity. Left calf veins are not well visualized on the basis of this study however, and thrombus in the structures cannot be excluded. Electronically Signed   By: Lynwood Landy Raddle M.D.   On: 11/24/2023 17:19     Procedures   Medications Ordered in the ED - No data to display                                  Medical Decision Making  38 year old female presents the emergency room with complaint of pain and swelling in her left anterior lower leg.  Does have some left calf tenderness without overlying erythema or palpable cords.  No history of PE or DVT.  Venous Doppler of the left lower extremity was obtained.  Unfortunately, radiology experienced downtime and imaging was not available for radiology to read.  Ultrasound tech wet read negative for DVT.  Discussed limited report with patient and her mother.  Advised them that I would contact them once her study had been formally read by the radiologist.  Formal study was completed and radiology reads study as equivocal, unable to evaluate the lower leg and calf veins.  Discussed with ER attending.  Recommendation is for patient to return for repeat study.  I called the patient however she was on another call and cannot take my call at that time.  I contacted her mother  with her permission at the time of visit and discussed equivocal results.  Requested patient return for repeat study.  Patient's mother states that she will relay the information to her daughter.  I have placed an order for a ultrasound to be completed on the left leg.     Final diagnoses:  Left leg swelling    ED Discharge Orders     None          Beverley Leita DELENA DEVONNA 11/24/23 1804    Tegeler, Lonni PARAS, MD 11/25/23 [phone]

## 2023-11-30 ENCOUNTER — Emergency Department (HOSPITAL_BASED_OUTPATIENT_CLINIC_OR_DEPARTMENT_OTHER)

## 2023-11-30 ENCOUNTER — Other Ambulatory Visit: Payer: Self-pay

## 2023-11-30 ENCOUNTER — Encounter (HOSPITAL_BASED_OUTPATIENT_CLINIC_OR_DEPARTMENT_OTHER): Payer: Self-pay | Admitting: Emergency Medicine

## 2023-11-30 ENCOUNTER — Emergency Department (HOSPITAL_BASED_OUTPATIENT_CLINIC_OR_DEPARTMENT_OTHER)
Admission: EM | Admit: 2023-11-30 | Discharge: 2023-11-30 | Disposition: A | Discharge status: Home / Self Care | Attending: Emergency Medicine | Admitting: Emergency Medicine

## 2023-11-30 DIAGNOSIS — Z79899 Other long term (current) drug therapy: Secondary | ICD-10-CM | POA: Diagnosis not present

## 2023-11-30 DIAGNOSIS — R0789 Other chest pain: Secondary | ICD-10-CM | POA: Insufficient documentation

## 2023-11-30 DIAGNOSIS — F419 Anxiety disorder, unspecified: Secondary | ICD-10-CM | POA: Insufficient documentation

## 2023-11-30 DIAGNOSIS — I10 Essential (primary) hypertension: Secondary | ICD-10-CM | POA: Insufficient documentation

## 2023-11-30 DIAGNOSIS — R519 Headache, unspecified: Secondary | ICD-10-CM | POA: Diagnosis not present

## 2023-11-30 LAB — CBC
HCT: 32.8 % — ABNORMAL LOW (ref 36.0–46.0)
Hemoglobin: 11 g/dL — ABNORMAL LOW (ref 12.0–15.0)
MCH: 27.4 pg (ref 26.0–34.0)
MCHC: 33.5 g/dL (ref 30.0–36.0)
MCV: 81.6 fL (ref 80.0–100.0)
Platelets: 248 K/uL (ref 150–400)
RBC: 4.02 MIL/uL (ref 3.87–5.11)
RDW: 14 % (ref 11.5–15.5)
WBC: 6.5 K/uL (ref 4.0–10.5)
nRBC: 0 % (ref 0.0–0.2)

## 2023-11-30 LAB — HEPATIC FUNCTION PANEL
ALT: <5 U/L (ref 0–44)
AST: 14 U/L — ABNORMAL LOW (ref 15–41)
Albumin: 4.5 g/dL (ref 3.5–5.0)
Alkaline Phosphatase: 51 U/L (ref 38–126)
Bilirubin, Direct: 0.1 mg/dL (ref 0.0–0.2)
Indirect Bilirubin: 0.2 mg/dL — ABNORMAL LOW (ref 0.3–0.9)
Total Bilirubin: 0.3 mg/dL (ref 0.0–1.2)
Total Protein: 7.1 g/dL (ref 6.5–8.1)

## 2023-11-30 LAB — URINALYSIS, ROUTINE W REFLEX MICROSCOPIC
Bilirubin Urine: NEGATIVE
Glucose, UA: NEGATIVE mg/dL
Hgb urine dipstick: NEGATIVE
Ketones, ur: NEGATIVE mg/dL
Leukocytes,Ua: NEGATIVE
Nitrite: NEGATIVE
Protein, ur: NEGATIVE mg/dL
Specific Gravity, Urine: 1.01 (ref 1.005–1.030)
pH: 6 (ref 5.0–8.0)

## 2023-11-30 LAB — BASIC METABOLIC PANEL WITH GFR
Anion gap: 15 (ref 5–15)
BUN: 8 mg/dL (ref 6–20)
CO2: 19 mmol/L — ABNORMAL LOW (ref 22–32)
Calcium: 9.5 mg/dL (ref 8.9–10.3)
Chloride: 103 mmol/L (ref 98–111)
Creatinine, Ser: 0.64 mg/dL (ref 0.44–1.00)
GFR, Estimated: >60 mL/min (ref >60)
Glucose, Bld: 150 mg/dL — ABNORMAL HIGH (ref 70–99)
Potassium: 3.6 mmol/L (ref 3.5–5.1)
Sodium: 137 mmol/L (ref 135–145)

## 2023-11-30 LAB — LITHIUM LEVEL: Lithium Lvl: <0.06 mmol/L — ABNORMAL LOW (ref 0.60–1.20)

## 2023-11-30 LAB — PREGNANCY, URINE: Preg Test, Ur: NEGATIVE

## 2023-11-30 LAB — TROPONIN T, HIGH SENSITIVITY: Troponin T High Sensitivity: <15 ng/L (ref <19)

## 2023-11-30 MED ORDER — SODIUM CHLORIDE 0.9 % IV BOLUS
1000.0000 mL | Freq: Once | INTRAVENOUS | Status: AC
  Administered 2023-11-30: 1000 mL via INTRAVENOUS

## 2023-11-30 MED ORDER — IOHEXOL 350 MG/ML SOLN
100.0000 mL | Freq: Once | INTRAVENOUS | Status: AC | PRN
  Administered 2023-11-30: 80 mL via INTRAVENOUS

## 2023-11-30 NOTE — ED Provider Notes (Signed)
 [facility] Provider Note   CSN: [id number] Arrival date & time: 11/30/23  1055     Patient presents with: Chest Pain   Tina Mcdaniel is a 38 y.o. female.   Pt is a 38 yo female with pmhx significant for anxiety, depression, HTN, HLD, anemia and PTSD.  Pt was here on 7/8 for leg swelling.  She had an US  which was wet read as neg, but when the final reading came back, the radiologist did not think the us  was adequate.  Pt was told to come back for another US , but she has not yet done so.  She said she's been under increased stress for the past week.  She has been severely anxious.  She also has a headache, chest pain, sob.  Pt has had some diarrhea.  She has not been eating/drinking well.  She did take a hydrocodone at 1000 pta which did help some.  Pt has been seen by cards for cp.  Work up not complete, but so far neg.  She is sure she is going to be at home and die of a stroke.         Prior to Admission medications   Medication Sig Start Date End Date Taking? Authorizing Provider  atenolol  (TENORMIN ) 100 MG tablet Take 1 tablet (100 mg total) by mouth daily. 10/23/20   Janifer Mitzie Retort, NP  busPIRone  (BUSPAR ) 10 MG tablet Take 1 tablet (10 mg total) by mouth 3 (three) times daily. 10/22/20   Janifer Mitzie Retort, NP  chlorthalidone  (HYGROTON ) 25 MG tablet Take 1 tablet (25 mg total) by mouth daily. 10/23/20   Janifer Mitzie Retort, NP  lithium  carbonate 300 MG capsule Take 1 capsule (300 mg total) by mouth 2 (two) times daily with a meal. 10/22/20   Janifer Mitzie Retort, NP  metFORMIN  (GLUCOPHAGE -XR) 500 MG 24 hr tablet Take 1 tablet (500 mg total) by mouth at bedtime. 10/22/20   Parks, Laurie Britton, NP  nicotine  (NICODERM CQ  - DOSED IN MG/24 HOURS) 21 mg/24hr patch Place 1 patch (21 mg total) onto the skin daily. 10/23/20   Janifer Mitzie Retort, NP  traZODone  (DESYREL ) 50 MG tablet Take 1 tablet (50 mg total) by mouth at bedtime as needed  for sleep. 10/22/20   Janifer Mitzie Retort, NP    Allergies: Morphine     Review of Systems  Respiratory:  Positive for shortness of breath.   Cardiovascular:  Positive for chest pain.  Musculoskeletal:        Subjective leg swelling  Neurological:  Positive for headaches.  Psychiatric/Behavioral:  The patient is nervous/anxious.   All other systems reviewed and are negative.   Updated Vital Signs BP (!) 153/87   Pulse 79   Temp 98.5 F (36.9 C)   Resp 13   Ht 5' 4 (1.626 m)   Wt 81.2 kg   LMP 11/17/2023   SpO2 99%   BMI 30.73 kg/m   Physical Exam Vitals and nursing note reviewed.  Constitutional:      Appearance: She is well-developed. She is obese.  HENT:     Head: Normocephalic and atraumatic.  Eyes:     Extraocular Movements: Extraocular movements intact.     Pupils: Pupils are equal, round, and reactive to light.  Cardiovascular:     Rate and Rhythm: Regular rhythm. Tachycardia present.     Heart sounds: Normal heart sounds.  Pulmonary:     Effort: Pulmonary effort is normal.  Breath sounds: Normal breath sounds.  Abdominal:     General: Bowel sounds are normal.     Palpations: Abdomen is soft.  Musculoskeletal:        General: Normal range of motion.     Cervical back: Normal range of motion and neck supple.     Comments: No significant swelling noted  Skin:    General: Skin is warm.     Capillary Refill: Capillary refill takes less than 2 seconds.  Neurological:     General: No focal deficit present.     Mental Status: She is alert and oriented to person, place, and time.  Psychiatric:        Mood and Affect: Mood is anxious.        Behavior: Behavior normal.     (all labs ordered are listed, but only abnormal results are displayed) Labs Reviewed  BASIC METABOLIC PANEL WITH GFR - Abnormal; Notable for the following components:      Result Value   CO2 19 (*)    Glucose, Bld 150 (*)    All other components within normal limits  CBC -  Abnormal; Notable for the following components:   Hemoglobin 11.0 (*)    HCT 32.8 (*)    All other components within normal limits  HEPATIC FUNCTION PANEL - Abnormal; Notable for the following components:   AST 14 (*)    Indirect Bilirubin 0.2 (*)    All other components within normal limits  PREGNANCY, URINE  URINALYSIS, ROUTINE W REFLEX MICROSCOPIC  LITHIUM  LEVEL  TROPONIN T, HIGH SENSITIVITY  TROPONIN T, HIGH SENSITIVITY    EKG: EKG Interpretation Date/Time:  Monday November 30 2023 11:08:01 EDT Ventricular Rate:  91 PR Interval:  146 QRS Duration:  107 QT Interval:  369 QTC Calculation: 454 R Axis:   21  Text Interpretation: Sinus rhythm Inferolateral infarct, old No significant change since last tracing Confirmed by Dean Clarity [phone]) on 11/30/2023 11:13:08 AM  Radiology: CT Angio Chest PE W and/or Wo Contrast Result Date: 11/30/2023 CLINICAL DATA:  Chest pain since yesterday. EXAM: CT ANGIOGRAPHY CHEST WITH CONTRAST TECHNIQUE: Multidetector CT imaging of the chest was performed using the standard protocol during bolus administration of intravenous contrast. Multiplanar CT image reconstructions and MIPs were obtained to evaluate the vascular anatomy. RADIATION DOSE REDUCTION: This exam was performed according to the departmental dose-optimization program which includes automated exposure control, adjustment of the mA and/or kV according to patient size and/or use of iterative reconstruction technique. CONTRAST:  80mL OMNIPAQUE  IOHEXOL  350 MG/ML SOLN COMPARISON:  January 04, 2016. FINDINGS: Cardiovascular: Satisfactory opacification of the pulmonary arteries to the segmental level. No evidence of pulmonary embolism. Normal heart size. No pericardial effusion. Mediastinum/Nodes: No enlarged mediastinal, hilar, or axillary lymph nodes. Thyroid gland, trachea, and esophagus demonstrate no significant findings. Lungs/Pleura: Lungs are clear. No pleural effusion or pneumothorax. Upper  Abdomen: No acute abnormality. Musculoskeletal: No chest wall abnormality. No acute or significant osseous findings. Review of the MIP images confirms the above findings. IMPRESSION: No definite evidence of pulmonary embolus. Electronically Signed   By: Lynwood Landy Raddle M.D.   On: 11/30/2023 13:44   CT Head Wo Contrast Result Date: 11/30/2023 CLINICAL DATA:  Headache, mental status changes EXAM: CT HEAD WITHOUT CONTRAST TECHNIQUE: Contiguous axial images were obtained from the base of the skull through the vertex without intravenous contrast. RADIATION DOSE REDUCTION: This exam was performed according to the departmental dose-optimization program which includes automated exposure control, adjustment of the  mA and/or kV according to patient size and/or use of iterative reconstruction technique. COMPARISON:  10/14/2018 FINDINGS: Brain: No acute intracranial abnormality. Specifically, no hemorrhage, hydrocephalus, mass lesion, acute infarction, or significant intracranial injury. Small area of encephalomalacia noted in the high right parietal region, new since prior study. This appears to reflect sequelae from old infarct. Vascular: No hyperdense vessel or unexpected calcification. Skull: No acute calvarial abnormality. 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 No acute intracranial abnormality. Electronically Signed   By: Franky Crease M.D.   On: 11/30/2023 13:40   US  Venous Img Lower Unilateral Left Result Date: 11/30/2023 CLINICAL DATA:  Left lateral/proximal calf swelling, pain EXAM: LEFT LOWER EXTREMITY VENOUS DOPPLER ULTRASOUND TECHNIQUE: Gray-scale sonography with graded compression, as well as color Doppler and duplex ultrasound were performed to evaluate the lower extremity deep venous systems from the level of the common femoral vein and including the common femoral, femoral, profunda femoral, popliteal and calf  veins including the posterior tibial, peroneal and gastrocnemius veins when visible. The superficial great saphenous vein was also interrogated. Spectral Doppler was utilized to evaluate flow at rest and with distal augmentation maneuvers in the common femoral, femoral and popliteal veins. COMPARISON:  November 24, 2023 FINDINGS: Contralateral Common Femoral Vein: Respiratory phasicity is normal and symmetric with the symptomatic side. No evidence of thrombus. Normal compressibility. Common Femoral Vein: No evidence of thrombus. Normal compressibility, respiratory phasicity and response to augmentation. Saphenofemoral Junction: No evidence of thrombus. Normal compressibility and flow on color Doppler imaging. Profunda Femoral Vein: No evidence of thrombus. Normal compressibility and flow on color Doppler imaging. Femoral Vein: No evidence of thrombus. Normal compressibility, respiratory phasicity and response to augmentation. Popliteal Vein: No evidence of thrombus. Normal compressibility, respiratory phasicity and response to augmentation. Calf Veins: No evidence of thrombus. Normal compressibility and flow on color Doppler imaging. Superficial Great Saphenous Vein: No evidence of thrombus. Normal compressibility. Other Findings: Small cystic structure in the popliteal fossa measuring 2.8 x 0.5 x 2.7 cm, possibly a small Baker's cyst. No acute sonographic abnormality in the area of concern in the calf. IMPRESSION: Negative for deep venous thrombosis in the left leg. Electronically Signed   By: Rogelia Myers M.D.   On: 11/30/2023 13:08     Procedures   Medications Ordered in the ED  sodium chloride  0.9 % bolus 1,000 mL (1,000 mLs Intravenous New Bag/Given 11/30/23 1141)  iohexol  (OMNIPAQUE ) 350 MG/ML injection 100 mL (80 mLs Intravenous Contrast Given 11/30/23 1335)                                    Medical Decision Making Amount and/or Complexity of Data Reviewed Labs: ordered. Radiology:  ordered.  Risk Prescription drug management.   This patient presents to the ED for concern of cp, this involves an extensive number of treatment options, and is a complaint that carries with it a high risk of complications and morbidity.  The differential diagnosis includes cardiac, pe, anemia, anxiety   Co morbidities that complicate the patient evaluation  anxiety, depression, HTN, HLD, anemia and PTSD   Additional history obtained:  Additional history obtained from epic chart review  Lab Tests:  I Ordered, and personally interpreted labs.  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  CT chest: No definite evidence of pulmonary embolus.  US : Negative for deep venous thrombosis in the left leg.  I agree with the radiologist interpretation   Cardiac Monitoring:  The patient was maintained on a cardiac monitor.  I personally viewed and interpreted the cardiac monitored which showed an underlying rhythm of: st   Medicines ordered and prescription drug management:  I ordered medication including ivfs  for sx  Reevaluation of the patient after these medicines showed that the patient improved I have reviewed the patients home medicines and have made adjustments as needed   Test Considered:  ct   Critical Interventions:  ivfs   Problem List / ED Course:  CP:  atypical.  No evidence of cardiac abn or PE.  Pt is stable for d/c.  Return if worse.  Anxiety:  pt acknowledges that her anxiety is severe.  She has an appt with her counselor and psychiatrist soon.  She is not suicidal or homicidal.     Reevaluation:  After the interventions noted above, I reevaluated the patient and found that  they have :improved   Social Determinants of Health:  Lives at home   Dispostion:  After consideration of the diagnostic results and the patients response to treatment, I feel that the patent would benefit from discharge with outpatient f/u.       Final diagnoses:  Atypical chest pain  Anxiety    ED Discharge Orders     None          Dean Clarity, MD 11/30/23 1432

## 2023-11-30 NOTE — ED Notes (Signed)
 US  BEING DONE

## 2023-11-30 NOTE — ED Triage Notes (Addendum)
 Pt POV- c/o increased stress x1 week, headache, chest pain, and L neck pain since yesterday. Diarrhea this AM.  Reports diaphoresis this AM, while in triage. Reports poor po intake x3 days.   Denies known sick contact, fever.   Pt appears anxious in triage. Took hydrocodone appx 1000 today, helped some.   Pt also reports being seen last week for LLe swelling, - for DVT, reports swelling ongoing.

## 2024-01-04 ENCOUNTER — Emergency Department (HOSPITAL_BASED_OUTPATIENT_CLINIC_OR_DEPARTMENT_OTHER)

## 2024-01-04 ENCOUNTER — Encounter (HOSPITAL_BASED_OUTPATIENT_CLINIC_OR_DEPARTMENT_OTHER): Payer: Self-pay | Admitting: Emergency Medicine

## 2024-01-04 ENCOUNTER — Emergency Department (HOSPITAL_BASED_OUTPATIENT_CLINIC_OR_DEPARTMENT_OTHER): Admission: EM | Admit: 2024-01-04 | Discharge: 2024-01-04 | Disposition: A | Discharge status: Home / Self Care

## 2024-01-04 ENCOUNTER — Other Ambulatory Visit: Payer: Self-pay

## 2024-01-04 DIAGNOSIS — S060XAA Concussion with loss of consciousness status unknown, initial encounter: Secondary | ICD-10-CM | POA: Insufficient documentation

## 2024-01-04 DIAGNOSIS — S0990XA Unspecified injury of head, initial encounter: Secondary | ICD-10-CM

## 2024-01-04 DIAGNOSIS — S0003XA Contusion of scalp, initial encounter: Secondary | ICD-10-CM | POA: Diagnosis not present

## 2024-01-04 DIAGNOSIS — W01190A Fall on same level from slipping, tripping and stumbling with subsequent striking against furniture, initial encounter: Secondary | ICD-10-CM | POA: Diagnosis not present

## 2024-01-04 LAB — URINALYSIS, ROUTINE W REFLEX MICROSCOPIC
Bilirubin Urine: NEGATIVE
Glucose, UA: NEGATIVE mg/dL
Hgb urine dipstick: NEGATIVE
Ketones, ur: NEGATIVE mg/dL
Leukocytes,Ua: NEGATIVE
Nitrite: NEGATIVE
Protein, ur: NEGATIVE mg/dL
Specific Gravity, Urine: 1.025 (ref 1.005–1.030)
pH: 6 (ref 5.0–8.0)

## 2024-01-04 MED ORDER — MAGNESIUM SULFATE 2 GM/50ML IV SOLN
2.0000 g | Freq: Once | INTRAVENOUS | Status: DC
  Filled 2024-01-04: qty 50

## 2024-01-04 MED ORDER — DIPHENHYDRAMINE HCL 50 MG/ML IJ SOLN
25.0000 mg | Freq: Once | INTRAMUSCULAR | Status: DC
  Filled 2024-01-04: qty 1

## 2024-01-04 MED ORDER — SODIUM CHLORIDE 0.9 % IV BOLUS: 1000.0000 mL | Freq: Once | INTRAVENOUS | Status: DC

## 2024-01-04 MED ORDER — METOCLOPRAMIDE HCL 5 MG/ML IJ SOLN
10.0000 mg | Freq: Once | INTRAMUSCULAR | Status: DC
  Filled 2024-01-04: qty 2

## 2024-01-04 MED ORDER — KETOROLAC TROMETHAMINE 15 MG/ML IJ SOLN
15.0000 mg | Freq: Once | INTRAMUSCULAR | Status: DC
  Filled 2024-01-04: qty 1

## 2024-01-04 NOTE — ED Triage Notes (Signed)
 States was really sleepy last night  fell and several times  and hit her head many times once  hit  kitchen table  from 1 am to 8 am, feels out of it  ambulatory to triage with diff, has taken vicodin states wasn't a szs

## 2024-01-04 NOTE — Discharge Instructions (Signed)
 As we discussed is concerned about your story for causes of syncope, presyncope, or other causes for your falls.  Since you declined any additional testing in the emergency department I cannot say whether there is any other worrisome causes of your fall other than your sleep difficulties.  I recommend that you follow-up closely with your primary care doctor for additional workup.  If you have any feeling of heart racing, shortness of breath, chest pain, worsening headache, vision changes, new numbness, tingling, or repeated falls I recommend that you return for further evaluation.  Please use Tylenol  or ibuprofen for pain.  You may use 600 mg ibuprofen every 6 hours or 1000 mg of Tylenol  every 6 hours.  You may choose to alternate between the 2.  This would be most effective.  Not to exceed 4 g of Tylenol  within 24 hours.  Not to exceed 3200 mg ibuprofen 24 hours.  You can continue take your home pain medicine in addition to the above.  Please note that your home Vicodin contains Tylenol  so would take it in place of Tylenol  if you need to take stronger pain medicine.

## 2024-01-04 NOTE — ED Notes (Signed)
 This RN went to start IV to give pt's ordered medications. Pt irritable and refusing medications, fluids, IV, and EKG. PA, Prosperi, made aware and at pt bedside to speak with pt.

## 2024-01-04 NOTE — ED Provider Notes (Signed)
 [facility] Provider Note   CSN: [id number] Arrival date & time: 01/04/24  1340     Patient presents with: Head Injury   Tina Mcdaniel is a 38 y.o. female with past medical history significant for mood disorder, anxiety, PTSD, depression, fibromyalgia presents concern for episode last night where she was feeling insomnia, then had sudden loss of consciousness / sleepiness, and struck her head on kitchen table, bathroom. Hx of seizures but did not feel like a seizure, reports she took some [place] and prior to arrival her pain.  Reports throbbing headache, wants to rule out concussion.  Patient becoming very agitated during assessment, reporting that she feels like we do not believe what she is saying.  I discussed with patient that I believe that she struck her head, offered her pain medicines that were appropriate for migraine and postconcussion.  She continues to insist that she does not feel like she is being treated appropriately.  Explained to the medical reasoning for our workup, treatment, and migraine cocktail medications that were offered, patient declines IV, or additional medication at this time, reports that she just wants her test results and to leave.    Head Injury      Prior to Admission medications   Medication Sig Start Date End Date Taking? Authorizing Provider  atenolol  (TENORMIN ) 100 MG tablet Take 1 tablet (100 mg total) by mouth daily. 10/23/20   Parks, Laurie Britton, NP  busPIRone  (BUSPAR ) 10 MG tablet Take 1 tablet (10 mg total) by mouth 3 (three) times daily. 10/22/20   Janifer Mitzie Retort, NP  chlorthalidone  (HYGROTON ) 25 MG tablet Take 1 tablet (25 mg total) by mouth daily. 10/23/20   Janifer Mitzie Retort, NP  lithium  carbonate 300 MG capsule Take 1 capsule (300 mg total) by mouth 2 (two) times daily with a meal. 10/22/20   Janifer Mitzie Retort, NP  metFORMIN  (GLUCOPHAGE -XR) 500 MG 24 hr tablet Take 1 tablet (500 mg  total) by mouth at bedtime. 10/22/20   Parks, Laurie Britton, NP  nicotine  (NICODERM CQ  - DOSED IN MG/24 HOURS) 21 mg/24hr patch Place 1 patch (21 mg total) onto the skin daily. 10/23/20   Parks, Laurie Britton, NP  traZODone  (DESYREL ) 50 MG tablet Take 1 tablet (50 mg total) by mouth at bedtime as needed for sleep. 10/22/20   Janifer Mitzie Retort, NP    Allergies: Morphine     Review of Systems  All other systems reviewed and are negative.   Updated Vital Signs BP (!) 145/87 (BP Location: Left Arm)   Pulse 81   Temp 97.9 F (36.6 C) (Oral)   Resp 18   Ht 5' 4 (1.626 m)   Wt 81.2 kg   SpO2 98%   BMI 30.73 kg/m   Physical Exam Vitals and nursing note reviewed.  Constitutional:      General: She is not in acute distress.    Appearance: Normal appearance.  HENT:     Head: Normocephalic and atraumatic.  Eyes:     General:        Right eye: No discharge.        Left eye: No discharge.  Cardiovascular:     Rate and Rhythm: Normal rate and regular rhythm.     Heart sounds: No murmur heard.    No friction rub. No gallop.  Pulmonary:     Effort: Pulmonary effort is normal.     Breath sounds: Normal breath sounds.  Abdominal:  General: Bowel sounds are normal.     Palpations: Abdomen is soft.  Skin:    General: Skin is warm and dry.     Capillary Refill: Capillary refill takes less than 2 seconds.     Comments: Mild redness, soft tissue swelling of the left forehead.  Neurological:     Mental Status: She is alert and oriented to person, place, and time.     Comments: Cranial nerves II through XII grossly intact.  Intact finger-nose, intact heel-to-shin.  Romberg negative, gait normal.  Alert and oriented x3.  Moves all 4 limbs spontaneously, normal coordination.  No pronator drift.  Intact strength 5 out of 5 bilateral upper and lower extremities.    Psychiatric:        Mood and Affect: Mood normal.        Behavior: Behavior normal.     (all labs ordered are listed, but  only abnormal results are displayed) Labs Reviewed  URINALYSIS, ROUTINE W REFLEX MICROSCOPIC  CBC  BASIC METABOLIC PANEL WITH GFR    EKG: None  Radiology: CT Head Wo Contrast Result Date: 01/04/2024 CLINICAL DATA:  Provided history: Head trauma, moderate/severe. Neck trauma, dangerous injury mechanism. Additional history provided: Fall (with head trauma). EXAM: CT HEAD WITHOUT CONTRAST CT CERVICAL SPINE WITHOUT CONTRAST TECHNIQUE: Multidetector CT imaging of the head and cervical spine was performed following the standard protocol without intravenous contrast. Multiplanar CT image reconstructions of the cervical spine were also generated. RADIATION DOSE REDUCTION: This exam was performed according to the departmental dose-optimization program which includes automated exposure control, adjustment of the mA and/or kV according to patient size and/or use of iterative reconstruction technique. COMPARISON:  Head CT 11/30/2023. Cervical spine radiographs 03/19/2018. FINDINGS: CT HEAD FINDINGS Brain: No age-advanced or lobar predominant cerebral atrophy. 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 There is no acute intracranial hemorrhage. No acute demarcated cortical infarct. No extra-axial fluid collection. No evidence of an intracranial mass. No midline shift. Vascular: No hyperdense vessel. Skull: No calvarial fracture or aggressive osseous lesion. Sinuses/Orbits: No mass or acute finding within the imaged orbits. No significant paranasal sinus disease at the imaged levels. CT CERVICAL SPINE FINDINGS Alignment: Nonspecific straightening of the expected cervical lordosis. No significant spondylolisthesis. Skull base and vertebrae: The basion-dental and atlanto-dental intervals are maintained.No evidence of acute fracture to the cervical spine. Soft tissues and spinal canal: No prevertebral fluid or swelling. No  visible canal hematoma. Disc levels: Cervical spondylosis with multilevel disc space narrowing, disc bulges/central disc protrusions and posterior disc osteophyte complexes. Disc space narrowing is greatest at C5-C6 (moderate-to-advanced at this level). No appreciable high-grade spinal canal stenosis. Uncovertebral hypertrophy results in bony neural foraminal narrowing on the right at C5-C6. Upper chest: No consolidation within the imaged lung apices. No visible pneumothorax IMPRESSION: CT head: 1.  No evidence of an acute intracranial abnormality. 2. 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 CT cervical spine: 1.  No evidence of an acute intracranial abnormality. 2. Nonspecific straightening of the expected cervical lordosis. 3. Cervical spondylosis as described. Electronically Signed   By: Rockey Childs D.O.   On: 01/04/2024 15:33   CT Cervical Spine Wo Contrast Result Date: 01/04/2024 CLINICAL DATA:  Provided history: Head trauma, moderate/severe. Neck trauma, dangerous injury mechanism. Additional history provided: Fall (with head trauma). EXAM: CT HEAD WITHOUT CONTRAST CT CERVICAL SPINE WITHOUT CONTRAST TECHNIQUE:  Multidetector CT imaging of the head and cervical spine was performed following the standard protocol without intravenous contrast. Multiplanar CT image reconstructions of the cervical spine were also generated. RADIATION DOSE REDUCTION: This exam was performed according to the departmental dose-optimization program which includes automated exposure control, adjustment of the mA and/or kV according to patient size and/or use of iterative reconstruction technique. COMPARISON:  Head CT 11/30/2023. Cervical spine radiographs 03/19/2018. FINDINGS: CT HEAD FINDINGS Brain: No age-advanced or lobar predominant cerebral atrophy. 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 There is no acute intracranial hemorrhage. No acute demarcated cortical infarct. No extra-axial fluid collection. No evidence of an intracranial mass. No midline shift. Vascular: No hyperdense vessel. Skull: No calvarial fracture or aggressive osseous lesion. Sinuses/Orbits: No mass or acute finding within the imaged orbits. No significant paranasal sinus disease at the imaged levels. CT CERVICAL SPINE FINDINGS Alignment: Nonspecific straightening of the expected cervical lordosis. No significant spondylolisthesis. Skull base and vertebrae: The basion-dental and atlanto-dental intervals are maintained.No evidence of acute fracture to the cervical spine. Soft tissues and spinal canal: No prevertebral fluid or swelling. No visible canal hematoma. Disc levels: Cervical spondylosis with multilevel disc space narrowing, disc bulges/central disc protrusions and posterior disc osteophyte complexes. Disc space narrowing is greatest at C5-C6 (moderate-to-advanced at this level). No appreciable high-grade spinal canal stenosis. Uncovertebral hypertrophy results in bony neural foraminal narrowing on the right at C5-C6. Upper chest: No consolidation within the imaged lung apices. No visible pneumothorax IMPRESSION: CT head: 1.  No evidence of an acute intracranial abnormality. 2. 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 CT cervical spine: 1.  No evidence of an acute intracranial abnormality. 2. Nonspecific straightening of the expected cervical lordosis. 3. Cervical spondylosis as described. Electronically Signed   By: Rockey Childs D.O.   On: 01/04/2024 15:33     Procedures   Medications Ordered in the ED - No data to display                                   Medical Decision Making Amount and/or Complexity of Data Reviewed Labs: ordered. Radiology: ordered.  Risk Prescription drug management.   This patient  is a 38 y.o. female  who presents to the ED for concern of head injury, questionable syncope vs other reason for spontaneous collapse.   Differential diagnoses prior to evaluation: The emergent differential diagnosis includes, but is not limited to,  epidural hematoma, subdural hematoma, skull fracture, subarachnoid hemorrhage, unstable cervical spine fracture, concussion vs other MSK injury,  . This is not an exhaustive differential.   Past Medical History / Co-morbidities / Social History: mood disorder, anxiety, PTSD, depression, fibromyalgia  Additional history: Chart reviewed. Pertinent results include: Reviewed lab work, imaging from previous emergency department visits, reviewed previous head CT which showed evidence of possible previous right parietal stroke with encephalomalacia which was new at time of last CT from 2020  Physical Exam: Physical exam performed. The pertinent findings include:  Cranial nerves II through XII grossly intact.  Intact finger-nose, intact heel-to-shin.  Romberg negative, gait normal.  Alert and oriented x3.  Moves all 4 limbs spontaneously, normal coordination.  No pronator drift.  Intact strength 5 out of 5 bilateral upper and lower extremities.  Mild redness, soft tissue swelling of the  left forehead.   Vital signs stable other than some hypertension, blood pressure 145/87 on arrival.  Lab Tests/Imaging studies: I personally interpreted labs/imaging and the pertinent results include: CT head unchanged from prior, some straightening of cervical unremarkable lordosis consistent with cervical spasm.  No other acute findings on imaging.   I wanted to perform lab work on the patient given her atypical story for her fall to evaluate for syncopal pathology, patient declined EKG, lab work at this time.  Medications: As discussed above I offered migraine cocktail to this patient for her pain, migraine, but she declined.  She is declining any additional workup.   Discussed I cannot fully evaluate the reasons for her fall, and was concerned about her story.  She does not want any additional workup, discharged at this time at patient request.  I do not see any focal neurologic deficits, I think that she is ultimately stable for PCP follow-up although her story again is concerning for atypical either narcoleptic event or syncope.   Disposition: After consideration of the diagnostic results and the patients response to treatment, I feel that patient is discharged in stable condition, encourage close follow-up.  She declined most of her emergency department workup today, I cannot fully evaluate for any worrisome causes of syncope or presyncope..   emergency department workup does not suggest an emergent condition requiring admission or immediate intervention beyond what has been performed at this time. The plan is: as above. The patient is safe for discharge and has been instructed to return immediately for worsening symptoms, change in symptoms or any other concerns.   Final diagnoses:  Injury of head, initial encounter  Concussion with unknown loss of consciousness status, initial encounter  Contusion of scalp, initial encounter    ED Discharge Orders     None          Rosan Sherlean VEAR DEVONNA 01/04/24 1547    Ula Prentice SAUNDERS, MD 01/05/24 1447

## 2024-02-23 ENCOUNTER — Ambulatory Visit: Admitting: Neurology

## 2024-02-23 ENCOUNTER — Encounter: Payer: Self-pay | Admitting: Neurology

## 2024-02-23 ENCOUNTER — Telehealth: Payer: Self-pay | Admitting: Neurology

## 2024-02-23 NOTE — Telephone Encounter (Signed)
 Patient called to reschedule no show appointment. Informed patient would be a no show of $50. Transferred patient to Billing.

## 2024-04-11 ENCOUNTER — Ambulatory Visit: Admitting: Neurology

## 2024-04-11 ENCOUNTER — Encounter: Payer: Self-pay | Admitting: Neurology

## 2024-04-11 ENCOUNTER — Telehealth: Payer: Self-pay | Admitting: Neurology

## 2024-04-11 NOTE — Telephone Encounter (Signed)
 Patient cancelled appointment due to woke up with sick, stuffy nose, sore throat, runny nose, headache, body aches. Transferred patient to Billing.
